# Patient Record
Sex: Female | Born: 1985 | Race: White | Hispanic: No | Marital: Married | Smoking: Never smoker
Health system: Southern US, Community
[De-identification: ages and names within clinical notes are randomized; demographics above are authoritative.]

## PROBLEM LIST (undated history)

## (undated) DIAGNOSIS — Z789 Other specified health status: Secondary | ICD-10-CM

## (undated) HISTORY — DX: Other specified health status: Z78.9

## (undated) HISTORY — PX: WISDOM TOOTH EXTRACTION: SHX21

## (undated) HISTORY — PX: TONSILLECTOMY: SUR1361

## (undated) HISTORY — PX: EYE SURGERY: SHX253

---

## 2018-05-14 ENCOUNTER — Other Ambulatory Visit (HOSPITAL_COMMUNITY): Payer: Self-pay | Admitting: Gastroenterology

## 2018-05-14 DIAGNOSIS — R1011 Right upper quadrant pain: Secondary | ICD-10-CM

## 2018-05-15 ENCOUNTER — Ambulatory Visit (HOSPITAL_COMMUNITY)
Admission: RE | Admit: 2018-05-15 | Discharge: 2018-05-15 | Disposition: A | Discharge status: Home / Self Care | Payer: 59 | Source: Ambulatory Visit | Attending: Gastroenterology | Admitting: Gastroenterology

## 2018-05-15 DIAGNOSIS — K5900 Constipation, unspecified: Secondary | ICD-10-CM | POA: Diagnosis present

## 2018-05-15 DIAGNOSIS — R1011 Right upper quadrant pain: Secondary | ICD-10-CM | POA: Diagnosis not present

## 2018-06-27 NOTE — L&D Delivery Note (Signed)
Delivery Note At 5:23 AM a viable and healthy female was delivered via Vaginal, Spontaneous (Presentation: OA  ).  APGAR: 7, 9; weight  pending.   Placenta status: spontaneous, intact.  Cord:  with the following complications: tight [place] x one cut on perineum.  Cord pH: na  Anesthesia:  epidural Episiotomy: None Lacerations: 2nd degree Suture Repair: 2.0 vicryl rapide Est. Blood Loss (mL): 300  Mom to postpartum.  Baby to Couplet care / Skin to Skin.  Jovontae Banko J 02/28/2019, 6:33 AM

## 2018-08-06 LAB — OB RESULTS CONSOLE RPR: RPR: NONREACTIVE

## 2018-08-06 LAB — OB RESULTS CONSOLE HEPATITIS B SURFACE ANTIGEN: Hepatitis B Surface Ag: NEGATIVE

## 2018-08-06 LAB — OB RESULTS CONSOLE HIV ANTIBODY (ROUTINE TESTING): HIV: NONREACTIVE

## 2018-08-06 LAB — OB RESULTS CONSOLE ABO/RH: RH Type: POSITIVE

## 2018-08-06 LAB — OB RESULTS CONSOLE RUBELLA ANTIBODY, IGM: Rubella: IMMUNE

## 2018-08-06 LAB — OB RESULTS CONSOLE GC/CHLAMYDIA
Chlamydia: NEGATIVE
Gonorrhea: NEGATIVE

## 2018-08-06 LAB — OB RESULTS CONSOLE ANTIBODY SCREEN: Antibody Screen: NEGATIVE

## 2019-02-05 LAB — OB RESULTS CONSOLE GBS: GBS: NEGATIVE

## 2019-02-15 ENCOUNTER — Encounter (HOSPITAL_COMMUNITY): Payer: Self-pay | Admitting: *Deleted

## 2019-02-15 ENCOUNTER — Telehealth (HOSPITAL_COMMUNITY): Payer: Self-pay | Admitting: *Deleted

## 2019-02-15 NOTE — Telephone Encounter (Signed)
Preadmission screen  

## 2019-02-21 ENCOUNTER — Other Ambulatory Visit: Payer: Self-pay | Admitting: Obstetrics and Gynecology

## 2019-02-26 ENCOUNTER — Other Ambulatory Visit: Payer: Self-pay

## 2019-02-26 ENCOUNTER — Other Ambulatory Visit (HOSPITAL_COMMUNITY)
Admission: RE | Admit: 2019-02-26 | Discharge: 2019-02-26 | Disposition: A | Discharge status: Home / Self Care | Payer: 59 | Source: Ambulatory Visit | Attending: Obstetrics and Gynecology | Admitting: Obstetrics and Gynecology

## 2019-02-26 LAB — SARS CORONAVIRUS 2 BY RT PCR (HOSPITAL ORDER, PERFORMED IN ~~LOC~~ HOSPITAL LAB): SARS Coronavirus 2: NEGATIVE

## 2019-02-26 NOTE — MAU Note (Signed)
Covid swab collected. PT tolerated well. PT asymptomatic 

## 2019-02-27 NOTE — H&P (Signed)
Lauren Ashley is a 33 y.o. female presenting for IOL at 37w with favorable cervix. OB History    Gravida  2   Para  1   Term      Preterm      AB      Living        SAB      TAB      Ectopic      Multiple      Live Births             Past Medical History:  Diagnosis Date  . Medical history non-contributory     Family History: family history includes Cancer in her father. Social History:  reports that she has never smoked. She has never used smokeless tobacco. She reports previous alcohol use. She reports that she does not use drugs.     Maternal Diabetes: No Genetic Screening: Normal Maternal Ultrasounds/Referrals: Normal Fetal Ultrasounds or other Referrals:  None Maternal Substance Abuse:  No Significant Maternal Medications:  None Significant Maternal Lab Results:  None Other Comments:  None  Review of Systems  Constitutional: Negative.   All other systems reviewed and are negative.  Maternal Medical History:  Contractions: Onset was less than 1 hour ago.   Frequency: rare.   Perceived severity is mild.    Fetal activity: Perceived fetal activity is normal.   Last perceived fetal movement was within the past hour.    Prenatal complications: no prenatal complications Prenatal Complications - Diabetes: none.      There were no vitals taken for this visit. Maternal Exam:  Uterine Assessment: Contraction strength is mild.  Contraction frequency is rare.   Abdomen: Patient reports no abdominal tenderness. Fetal presentation: vertex  Introitus: Normal vulva. Normal vagina.  Ferning test: not done.  Nitrazine test: not done. Amniotic fluid character: not assessed.  Pelvis: adequate for delivery.   Cervix: Cervix evaluated by digital exam.     Physical Exam  Nursing note and vitals reviewed. Constitutional: She is oriented to person, place, and time. She appears well-developed and well-nourished.  HENT:  Head: Normocephalic and  atraumatic.  Neck: Normal range of motion. Neck supple.  Cardiovascular: Normal rate, regular rhythm and normal heart sounds.  Respiratory: Effort normal and breath sounds normal.  GI: Soft. Bowel sounds are normal.  Genitourinary:    Vulva, vagina and uterus normal.   Musculoskeletal: Normal range of motion.  Neurological: She is alert and oriented to person, place, and time.  Skin: Skin is warm and dry.    Prenatal labs: ABO, Rh: O/Positive/-- (02/10 0000) Antibody: Negative (02/10 0000) Rubella: Immune (02/10 0000) RPR: Nonreactive (02/10 0000)  HBsAg: Negative (02/10 0000)  HIV: Non-reactive (02/10 0000)  GBS: Negative (08/11 0000)   Assessment/Plan: 39 wk IUP Favorable VE Pitocin IOL   Lauren Ashley J 02/27/2019, 10:21 PM

## 2019-02-28 ENCOUNTER — Inpatient Hospital Stay (HOSPITAL_COMMUNITY): Payer: 59

## 2019-02-28 ENCOUNTER — Encounter (HOSPITAL_COMMUNITY): Payer: Self-pay | Admitting: Family Medicine

## 2019-02-28 ENCOUNTER — Inpatient Hospital Stay (HOSPITAL_COMMUNITY)
Admission: AD | Admit: 2019-02-28 | Discharge: 2019-03-01 | DRG: 807 | Disposition: A | Discharge status: Home / Self Care | Payer: 59 | Attending: Obstetrics and Gynecology | Admitting: Obstetrics and Gynecology

## 2019-02-28 ENCOUNTER — Inpatient Hospital Stay (HOSPITAL_COMMUNITY): Payer: 59 | Admitting: Anesthesiology

## 2019-02-28 ENCOUNTER — Other Ambulatory Visit: Payer: Self-pay

## 2019-02-28 DIAGNOSIS — Z3490 Encounter for supervision of normal pregnancy, unspecified, unspecified trimester: Secondary | ICD-10-CM | POA: Diagnosis present

## 2019-02-28 DIAGNOSIS — O26893 Other specified pregnancy related conditions, third trimester: Secondary | ICD-10-CM | POA: Diagnosis present

## 2019-02-28 DIAGNOSIS — Z3A39 39 weeks gestation of pregnancy: Secondary | ICD-10-CM | POA: Diagnosis not present

## 2019-02-28 LAB — CBC
HCT: 35.1 % — ABNORMAL LOW (ref 36.0–46.0)
Hemoglobin: 12.5 g/dL (ref 12.0–15.0)
MCH: 32.5 pg (ref 26.0–34.0)
MCHC: 35.6 g/dL (ref 30.0–36.0)
MCV: 91.2 fL (ref 80.0–100.0)
Platelets: 179 10*3/uL (ref 150–400)
RBC: 3.85 MIL/uL — ABNORMAL LOW (ref 3.87–5.11)
RDW: 13.3 % (ref 11.5–15.5)
WBC: 10.4 10*3/uL (ref 4.0–10.5)
nRBC: 0 % (ref 0.0–0.2)

## 2019-02-28 LAB — RPR: RPR Ser Ql: NONREACTIVE

## 2019-02-28 LAB — TYPE AND SCREEN
ABO/RH(D): O POS
Antibody Screen: NEGATIVE

## 2019-02-28 LAB — ABO/RH: ABO/RH(D): O POS

## 2019-02-28 MED ORDER — OXYTOCIN 40 UNITS IN NORMAL SALINE INFUSION - SIMPLE MED
1.0000 m[IU]/min | INTRAVENOUS | Status: DC
  Administered 2019-02-28: 1 m[IU]/min via INTRAVENOUS
  Filled 2019-02-28: qty 1000

## 2019-02-28 MED ORDER — TETANUS-DIPHTH-ACELL PERTUSSIS 5-2.5-18.5 LF-MCG/0.5 IM SUSP: 0.5000 mL | Freq: Once | INTRAMUSCULAR | Status: DC

## 2019-02-28 MED ORDER — OXYTOCIN 40 UNITS IN NORMAL SALINE INFUSION - SIMPLE MED: 2.5000 [IU]/h | INTRAVENOUS | Status: DC

## 2019-02-28 MED ORDER — AMMONIA AROMATIC IN INHA
RESPIRATORY_TRACT | Status: AC
  Filled 2019-02-28: qty 10

## 2019-02-28 MED ORDER — EPHEDRINE 5 MG/ML INJ: 10.0000 mg | INTRAVENOUS | Status: DC | PRN

## 2019-02-28 MED ORDER — ACETAMINOPHEN 325 MG PO TABS: 650.0000 mg | ORAL_TABLET | ORAL | Status: DC | PRN

## 2019-02-28 MED ORDER — TERBUTALINE SULFATE 1 MG/ML IJ SOLN: 0.2500 mg | Freq: Once | INTRAMUSCULAR | Status: DC | PRN

## 2019-02-28 MED ORDER — PHENYLEPHRINE 40 MCG/ML (10ML) SYRINGE FOR IV PUSH (FOR BLOOD PRESSURE SUPPORT): 80.0000 ug | PREFILLED_SYRINGE | INTRAVENOUS | Status: DC | PRN

## 2019-02-28 MED ORDER — METHYLERGONOVINE MALEATE 0.2 MG/ML IJ SOLN: 0.2000 mg | INTRAMUSCULAR | Status: DC | PRN

## 2019-02-28 MED ORDER — FENTANYL CITRATE (PF) 100 MCG/2ML IJ SOLN: 50.0000 ug | INTRAMUSCULAR | Status: DC | PRN

## 2019-02-28 MED ORDER — OXYCODONE-ACETAMINOPHEN 5-325 MG PO TABS: 1.0000 | ORAL_TABLET | ORAL | Status: DC | PRN

## 2019-02-28 MED ORDER — LACTATED RINGERS IV SOLN
500.0000 mL | INTRAVENOUS | Status: DC | PRN
  Administered 2019-02-28 (×2): 500 mL via INTRAVENOUS

## 2019-02-28 MED ORDER — IBUPROFEN 600 MG PO TABS
600.0000 mg | ORAL_TABLET | Freq: Four times a day (QID) | ORAL | Status: DC
  Administered 2019-02-28 – 2019-03-01 (×5): 600 mg via ORAL
  Filled 2019-02-28 (×5): qty 1

## 2019-02-28 MED ORDER — COCONUT OIL OIL: 1.0000 "application " | TOPICAL_OIL | Status: DC | PRN

## 2019-02-28 MED ORDER — DIPHENHYDRAMINE HCL 50 MG/ML IJ SOLN: 12.5000 mg | INTRAMUSCULAR | Status: DC | PRN

## 2019-02-28 MED ORDER — LACTATED RINGERS IV SOLN: 500.0000 mL | Freq: Once | INTRAVENOUS | Status: DC

## 2019-02-28 MED ORDER — METHYLERGONOVINE MALEATE 0.2 MG PO TABS: 0.2000 mg | ORAL_TABLET | ORAL | Status: DC | PRN

## 2019-02-28 MED ORDER — DIPHENHYDRAMINE HCL 25 MG PO CAPS: 25.0000 mg | ORAL_CAPSULE | Freq: Four times a day (QID) | ORAL | Status: DC | PRN

## 2019-02-28 MED ORDER — ONDANSETRON HCL 4 MG/2ML IJ SOLN: 4.0000 mg | INTRAMUSCULAR | Status: DC | PRN

## 2019-02-28 MED ORDER — ONDANSETRON HCL 4 MG/2ML IJ SOLN: 4.0000 mg | Freq: Four times a day (QID) | INTRAMUSCULAR | Status: DC | PRN

## 2019-02-28 MED ORDER — DIBUCAINE (PERIANAL) 1 % EX OINT: 1.0000 "application " | TOPICAL_OINTMENT | CUTANEOUS | Status: DC | PRN

## 2019-02-28 MED ORDER — LIDOCAINE HCL (PF) 1 % IJ SOLN
INTRAMUSCULAR | Status: DC | PRN
  Administered 2019-02-28: 5 mL via EPIDURAL

## 2019-02-28 MED ORDER — BENZOCAINE-MENTHOL 20-0.5 % EX AERO
1.0000 "application " | INHALATION_SPRAY | CUTANEOUS | Status: DC | PRN
  Administered 2019-02-28 – 2019-03-01 (×2): 1 via TOPICAL
  Filled 2019-02-28 (×2): qty 56

## 2019-02-28 MED ORDER — WITCH HAZEL-GLYCERIN EX PADS: 1.0000 "application " | MEDICATED_PAD | CUTANEOUS | Status: DC | PRN

## 2019-02-28 MED ORDER — SOD CITRATE-CITRIC ACID 500-334 MG/5ML PO SOLN: 30.0000 mL | ORAL | Status: DC | PRN

## 2019-02-28 MED ORDER — SODIUM CHLORIDE (PF) 0.9 % IJ SOLN
INTRAMUSCULAR | Status: DC | PRN
  Administered 2019-02-28: 12 mL/h via EPIDURAL

## 2019-02-28 MED ORDER — PHENYLEPHRINE 40 MCG/ML (10ML) SYRINGE FOR IV PUSH (FOR BLOOD PRESSURE SUPPORT)
80.0000 ug | PREFILLED_SYRINGE | INTRAVENOUS | Status: DC | PRN
  Filled 2019-02-28: qty 10

## 2019-02-28 MED ORDER — PRENATAL MULTIVITAMIN CH
1.0000 | ORAL_TABLET | Freq: Every day | ORAL | Status: DC
  Administered 2019-02-28 – 2019-03-01 (×2): 1 via ORAL
  Filled 2019-02-28 (×2): qty 1

## 2019-02-28 MED ORDER — ONDANSETRON HCL 4 MG PO TABS: 4.0000 mg | ORAL_TABLET | ORAL | Status: DC | PRN

## 2019-02-28 MED ORDER — OXYTOCIN BOLUS FROM INFUSION
500.0000 mL | Freq: Once | INTRAVENOUS | Status: AC
  Administered 2019-02-28: 500 mL via INTRAVENOUS

## 2019-02-28 MED ORDER — SIMETHICONE 80 MG PO CHEW: 80.0000 mg | CHEWABLE_TABLET | ORAL | Status: DC | PRN

## 2019-02-28 MED ORDER — ZOLPIDEM TARTRATE 5 MG PO TABS: 5.0000 mg | ORAL_TABLET | Freq: Every evening | ORAL | Status: DC | PRN

## 2019-02-28 MED ORDER — SENNOSIDES-DOCUSATE SODIUM 8.6-50 MG PO TABS
2.0000 | ORAL_TABLET | ORAL | Status: DC
  Administered 2019-02-28 – 2019-03-01 (×2): 2 via ORAL
  Filled 2019-02-28 (×2): qty 2

## 2019-02-28 MED ORDER — LACTATED RINGERS IV SOLN
INTRAVENOUS | Status: DC
  Administered 2019-02-28: via INTRAVENOUS

## 2019-02-28 MED ORDER — LIDOCAINE HCL (PF) 1 % IJ SOLN: 30.0000 mL | INTRAMUSCULAR | Status: DC | PRN

## 2019-02-28 MED ORDER — OXYCODONE-ACETAMINOPHEN 5-325 MG PO TABS: 2.0000 | ORAL_TABLET | ORAL | Status: DC | PRN

## 2019-02-28 MED ORDER — FENTANYL-BUPIVACAINE-NACL 0.5-0.125-0.9 MG/250ML-% EP SOLN
12.0000 mL/h | EPIDURAL | Status: DC | PRN
  Filled 2019-02-28: qty 250

## 2019-02-28 NOTE — Lactation Note (Signed)
This note was copied from a baby's chart. Lactation Consultation Note  Patient Name: Lauren Ashley [id number] Date: 02/28/2019 Reason for consult: Initial assessment;Term  8 hours old FT female who is being exclusively BF by her mother, she's a P2 and experienced BF but needed some reassurance. Mom participated in the [facility] program at the [facility] but she told [place] she wasn't familiar with hand expression. LC showed mom how to hand express and she was able to get several drops of colostrum very easily out of both breast, LC rubbed them in baby's mouth. Mom requested another "teach back" before [place] left the room with the same results, she was successful in getting plenty of colostrum. She said she didn't have a good experience the first time BF and that no one told her what to do.  She has a Spectra S2 DEBP at home and a [place] that she brought to the hospital. Mom requested to be sized for her Spectra pump, she brought the flanges. Explained to mom that to do correct sizing the pump needs to be working and the nipple in motion, but so far she seems to be a # 24. LC also brought a hand pump, instructions, cleaning and storage were reviewed as well as milk storage guidelines.  Offered assistance with latch but mom politely declined, baby was asleep and she said she already fed. Asked mom to call for assistance when needed. Mom had lots of questions and LC answered them all to her satisfaction. Reviewed normal newborn behavior, size of baby's stomach, feeding cues, cluster feeding, pumping schedule and lactogenesis II.   Feeding plan  1. Encouraged mom to feed baby STS 8-12 times/24 hours or sooner if feeding cues are present 2. Hand expression and spoon feeding were also encouraged as a first option for the first few days instead of pumping  BF brochure, BF resources and feeding diary were reviewed. Parents reported all questions and concerns were answered, they're both aware of [place] OP services and will  call PRN.   Maternal Data Formula Feeding for Exclusion: No Has patient been taught Hand Expression?: Yes Does the patient have breastfeeding experience prior to this delivery?: Yes  Feeding Feeding Type: Breast Fed  LATCH Score Latch: Repeated attempts needed to sustain latch, nipple held in mouth throughout feeding, stimulation needed to elicit sucking reflex.  Audible Swallowing: A few with stimulation  Type of Nipple: Everted at rest and after stimulation  Comfort (Breast/Nipple): Soft / non-tender  Hold (Positioning): Assistance needed to correctly position infant at breast and maintain latch.  LATCH Score: 7  Interventions Interventions: Breast feeding basics reviewed;Breast massage;Hand express;Breast compression;Hand pump  Lactation Tools Discussed/Used Tools: Pump Breast pump type: Manual WIC Program: Yes Pump Review: Setup, frequency, and cleaning;Milk Storage Initiated by:: MPeck Date initiated:: 02/28/19   Consult Status Consult Status: Follow-up Date: 03/01/19 Follow-up type: In-patient    Lauren Ashley 02/28/2019, 1:29 PM

## 2019-02-28 NOTE — Anesthesia Preprocedure Evaluation (Signed)
Anesthesia Evaluation  Patient identified by MRN, date of birth, ID band Patient awake    Reviewed: Allergy & Precautions, Patient's Chart, lab work & pertinent test results  Airway Mallampati: II       Dental no notable dental hx.    Pulmonary    Pulmonary exam normal        Cardiovascular Normal cardiovascular exam     Neuro/Psych    GI/Hepatic   Endo/Other    Renal/GU      Musculoskeletal   Abdominal   Peds  Hematology   Anesthesia Other Findings   Reproductive/Obstetrics (+) Pregnancy                             Anesthesia Physical Anesthesia Plan  ASA: II  Anesthesia Plan: Epidural   Post-op Pain Management:    Induction:   PONV Risk Score and Plan:   Airway Management Planned:   Additional Equipment: None  Intra-op Plan:   Post-operative Plan:   Informed Consent: I have reviewed the patients History and Physical, chart, labs and discussed the procedure including the risks, benefits and alternatives for the proposed anesthesia with the patient or authorized representative who has indicated his/her understanding and acceptance.       Plan Discussed with:   Anesthesia Plan Comments: (Lab Results      Component                Value               Date                      WBC                      10.4                02/28/2019                HGB                      12.5                02/28/2019                HCT                      35.1 (L)            02/28/2019                MCV                      91.2                02/28/2019                PLT                      179                 02/28/2019           )        Anesthesia Quick Evaluation

## 2019-02-28 NOTE — Anesthesia Procedure Notes (Signed)
Epidural Patient location during procedure: OB Start time: 02/28/2019 2:33 AM End time: 02/28/2019 2:39 AM  Staffing Anesthesiologist: Effie Berkshire, MD Performed: anesthesiologist   Preanesthetic Checklist Completed: patient identified, site marked, surgical consent, pre-op evaluation, timeout performed, IV checked, risks and benefits discussed and monitors and equipment checked  Epidural Patient position: sitting Prep: ChloraPrep Patient monitoring: heart rate, continuous pulse ox and blood pressure Approach: midline Location: L3-L4 Injection technique: LOR saline  Needle:  Needle type: Tuohy  Needle gauge: 17 G Needle length: 9 cm Catheter type: closed end flexible Catheter size: 20 Guage Test dose: negative and 1.5% lidocaine  Assessment Events: blood not aspirated, injection not painful, no injection resistance and no paresthesia  Additional Notes LOR @ 4  Patient identified. Risks/Benefits/Options discussed with patient including but not limited to bleeding, infection, nerve damage, paralysis, failed block, incomplete pain control, headache, blood pressure changes, nausea, vomiting, reactions to medications, itching and postpartum back pain. Confirmed with bedside nurse the patient's most recent platelet count. Confirmed with patient that they are not currently taking any anticoagulation, have any bleeding history or any family history of bleeding disorders. Patient expressed understanding and wished to proceed. All questions were answered. Sterile technique was used throughout the entire procedure. Please see nursing notes for vital signs. Test dose was given through epidural catheter and negative prior to continuing to dose epidural or start infusion. Warning signs of high block given to the patient including shortness of breath, tingling/numbness in hands, complete motor block, or any concerning symptoms with instructions to call for help. Patient was given instructions on  fall risk and not to get out of bed. All questions and concerns addressed with instructions to call with any issues or inadequate analgesia.    Reason for block:procedure for pain

## 2019-02-28 NOTE — Anesthesia Postprocedure Evaluation (Signed)
Anesthesia Post Note  Patient: Lauren Ashley  Procedure(s) Performed: AN AD [place]     Patient location during evaluation: Mother Baby Anesthesia Type: Epidural Level of consciousness: awake and alert Pain management: pain level controlled Vital Signs Assessment: post-procedure vital signs reviewed and stable Respiratory status: spontaneous breathing, nonlabored ventilation and respiratory function stable Cardiovascular status: stable Postop Assessment: no headache, no backache and epidural receding Anesthetic complications: no    Last Vitals:  Vitals:   02/28/19 0745 02/28/19 0930  BP: 110/69 (!) 103/58  Pulse: 69 73  Resp: 20 18  Temp: 36.9 C 37 C    Last Pain:  Vitals:   02/28/19 0930  TempSrc: Oral  PainSc: 2    Pain Goal:                   Zoe Nordin

## 2019-03-01 LAB — CBC
HCT: 30.7 % — ABNORMAL LOW (ref 36.0–46.0)
Hemoglobin: 10.7 g/dL — ABNORMAL LOW (ref 12.0–15.0)
MCH: 33 pg (ref 26.0–34.0)
MCHC: 34.9 g/dL (ref 30.0–36.0)
MCV: 94.8 fL (ref 80.0–100.0)
Platelets: 144 10*3/uL — ABNORMAL LOW (ref 150–400)
RBC: 3.24 MIL/uL — ABNORMAL LOW (ref 3.87–5.11)
RDW: 13.7 % (ref 11.5–15.5)
WBC: 9.1 10*3/uL (ref 4.0–10.5)
nRBC: 0 % (ref 0.0–0.2)

## 2019-03-01 MED ORDER — BENZOCAINE-MENTHOL 20-0.5 % EX AERO: 1.0000 "application " | INHALATION_SPRAY | CUTANEOUS | Status: DC | PRN

## 2019-03-01 MED ORDER — SENNOSIDES-DOCUSATE SODIUM 8.6-50 MG PO TABS: 2.0000 | ORAL_TABLET | ORAL | Status: DC

## 2019-03-01 MED ORDER — ACETAMINOPHEN 325 MG PO TABS: 650.0000 mg | ORAL_TABLET | ORAL | Status: DC | PRN

## 2019-03-01 MED ORDER — COCONUT OIL OIL: 1.0000 "application " | TOPICAL_OIL | 0 refills | Status: DC | PRN

## 2019-03-01 MED ORDER — IBUPROFEN 600 MG PO TABS: 600.0000 mg | ORAL_TABLET | Freq: Four times a day (QID) | ORAL | 0 refills | Status: DC

## 2019-03-01 MED ORDER — WITCH HAZEL-GLYCERIN EX PADS: 1.0000 "application " | MEDICATED_PAD | CUTANEOUS | 12 refills | Status: DC | PRN

## 2019-03-01 NOTE — Lactation Note (Signed)
This note was copied from a baby's chart. Lactation Consultation Note  Patient Name: Lauren Ashley [id number] Date: 03/01/2019 Reason for consult: Follow-up assessment   P2, Baby 56 hours old and latched upon entering. Intermittent sucks and swallows observed. Mother requested hand expression review.  Teachback with good flow. Mother has abrasion on tip of L nipple. Make sure baby is deep on breast and unlatch if she slips down and re-latch.  Suggest applying ebm and provided mother with comfort gels. Feed on demand with cues.  Goal 8-12+ times per day after first 24 hrs.  Place baby STS if not cueing.  Reviewed engorgement care and monitoring voids/stools.    Maternal Data Has patient been taught Hand Expression?: Yes  Feeding Feeding Type: Breast Fed  LATCH Score Latch: Grasps breast easily, tongue down, lips flanged, rhythmical sucking.(latched upon entering)  Audible Swallowing: A few with stimulation  Type of Nipple: Everted at rest and after stimulation  Comfort (Breast/Nipple): Filling, red/small blisters or bruises, mild/mod discomfort  Hold (Positioning): No assistance needed to correctly position infant at breast.  LATCH Score: 8  Interventions Interventions: Breast feeding basics reviewed;Comfort gels;Hand pump  Lactation Tools Discussed/Used     Consult Status Consult Status: Complete Date: 03/01/19    Vivianne Master [facility] 03/01/2019, 8:33 AM

## 2019-03-01 NOTE — Progress Notes (Signed)
Patient ID: Lauren Ashley, female   DOB: 1986-03-10, 33 y.o.   MRN: [id number] PPD # 1 S/P NSVD  Live born female  Birth Weight: 6 lb 5.9 oz (2889 g) APGAR: 7, 9  Newborn Delivery   Birth date/time: 02/28/2019 05:23:00 Delivery type: Vaginal, Spontaneous     Baby name: Ermalinda Barrios Delivering provider: Brien Few  Episiotomy:None   Lacerations:2nd degree   Feeding: breast  Pain control at delivery: Epidural   S:  Reports feeling well, desires early DC             Tolerating po/ No nausea or vomiting             Bleeding is light             Pain controlled with ibuprofen (OTC)             Up ad lib / ambulatory / voiding without difficulties   O:  A & O x 3, in no apparent distress              VS:  Vitals:   02/28/19 1640 02/28/19 2030 02/28/19 2210 03/01/19 0541  BP: 108/64 121/75 105/70 113/72  Pulse: 67 76 69 77  Resp: 18 18 17 18   Temp: 98.5 F (36.9 C) 99.2 F (37.3 C) 97.6 F (36.4 C) 98 F (36.7 C)  TempSrc: Oral Oral Oral Oral  SpO2: 99%  99% 100%  Weight:      Height:        LABS:  Recent Labs    02/28/19 0010 03/01/19 0522  WBC 10.4 9.1  HGB 12.5 10.7*  HCT 35.1* 30.7*  PLT 179 144*    Blood type: --/--/O POS, O POS Performed at [facility], [address] [place], [place] [address]  [phone] [id number]/03 0015)  Rubella: Immune (02/10 0000)   I&O: I/O last 3 completed shifts: In: 0  Out: 513 [Urine:50; Blood:463]          No intake/output data recorded.  Vaccines: TDaP UTD         Flu    NA   Lungs: Clear and unlabored  Heart: regular rate and rhythm / no murmurs  Abdomen: soft, non-tender, non-distended             Fundus: firm, non-tender, U-1  Perineum: repair intact, no edema  Lochia: small  Extremities: no edema, no calf pain or tenderness    A/P: PPD # 1 33 y.o., G2P1001   Principal Problem:   Postpartum care following vaginal delivery (9/3) Active Problems:   Encounter for planned induction of labor   SVD (spontaneous  vaginal delivery)   Second degree perineal laceration during delivery   Doing well - stable status  Routine post partum orders             DC home today w/ instructions  F/U at [facility] OB[facility]N office in 6 weeks and PRN    Juliene Pina, MSN, CNM 03/01/2019, 9:23 AM

## 2019-03-01 NOTE — Discharge Summary (Signed)
Obstetric Discharge Summary Reason for Admission: induction of labor Prenatal Procedures: ultrasound Intrapartum Procedures: spontaneous vaginal delivery and epidural, Pitocin Postpartum Procedures: none Complications-Operative and Postpartum: 2nd degree perineal laceration Hemoglobin  Date Value Ref Range Status  03/01/2019 10.7 (L) 12.0 - 15.0 g/dL Final   HCT  Date Value Ref Range Status  03/01/2019 30.7 (L) 36.0 - 46.0 % Final    Physical Exam:  General: alert, cooperative and no distress Lochia: appropriate Uterine Fundus: firm Incision: healing well DVT Evaluation: No cords or calf tenderness. No significant calf/ankle edema.  Discharge Diagnoses: Principal Problem:   Postpartum care following vaginal delivery (9/3) Active Problems:   Encounter for planned induction of labor   SVD (spontaneous vaginal delivery)   Second degree perineal laceration during delivery    Discharge Information: Date: 03/01/2019 Activity: pelvic rest Diet: routine Medications:  Allergies as of 03/01/2019      Reactions   Erythromycin Nausea And Vomiting   Penicillins Nausea And Vomiting      Medication List    STOP taking these medications   diphenhydrAMINE 25 MG tablet Commonly known as: BENADRYL     TAKE these medications   acetaminophen 325 MG tablet Commonly known as: Tylenol Take 2 tablets (650 mg total) by mouth every 4 (four) hours as needed (for pain scale < 4).   benzocaine-Menthol 20-0.5 % Aero Commonly known as: DERMOPLAST Apply 1 application topically as needed for irritation (perineal discomfort).   coconut oil Oil Apply 1 application topically as needed.   famotidine 20 MG tablet Commonly known as: PEPCID Take 20 mg by mouth 2 (two) times daily.   ibuprofen 600 MG tablet Commonly known as: ADVIL Take 1 tablet (600 mg total) by mouth every 6 (six) hours.   PRENATAL ADULT GUMMY/DHA/FA PO Take 2 each by mouth daily.   senna-docusate 8.6-50 MG  tablet Commonly known as: Senokot-S Take 2 tablets by mouth daily. Start taking on: March 02, 2019   witch hazel-glycerin pad Commonly known as: TUCKS Apply 1 application topically as needed for hemorrhoids.            Discharge Care Instructions  (From admission, onward)         Start     Ordered   03/01/19 0000  Discharge wound care:    Comments: Sitz baths 2 times /day with warm water x 1 week. May add herbals: 1 ounce dried comfrey leaf* 1 ounce calendula flowers 1 ounce lavender flowers 1/2 ounce dried uva ursi leaves 1/2 ounce witch hazel blossoms (if you can find them) 1/2 ounce dried sage leaf 1/2 cup sea salt Directions: Bring 2 quarts of water to a boil. Turn off heat, and place 1 ounce (approximately 1 large handful) of the above mixed herbs (not the salt) into the pot. Steep, covered, for 30 minutes.  Strain the liquid well with a fine mesh strainer, and discard the herb material. Add 2 quarts of liquid to the tub, along with the 1/2 cup of salt. This medicinal liquid can also be made into compresses and peri-rinses.   03/01/19 1108         Condition: stable Instructions: refer to practice specific booklet Discharge to: home Follow-up Information    Olivia Mackieaavon, Richard, MD. Schedule an appointment as soon as possible for a visit in 6 week(s).   Specialty: Obstetrics and Gynecology Contact information: [address] [place] [address] [phone]           Newborn Data: Live born female Marga HootsOakley  Birth Weight: 6 lb 5.9 oz (2889 g) APGAR: 7, 9  Newborn Delivery   Birth date/time: 02/28/2019 05:23:00 Delivery type: Vaginal, Spontaneous      Home with mother.  Juliene Pina, CNM 03/01/2019, 11:09 AM

## 2019-04-01 IMAGING — US US ABDOMEN COMPLETE
1 series · 14 of 25 positions shown · non-contrast
Comparison: CT abdomen pelvis of 05/13/2018

CLINICAL DATA: Right upper quadrant abdominal pain

EXAM:
ABDOMEN ULTRASOUND COMPLETE

[Series 1: us abdomen complete · 14 of 72 slices shown]
[im 1/72]
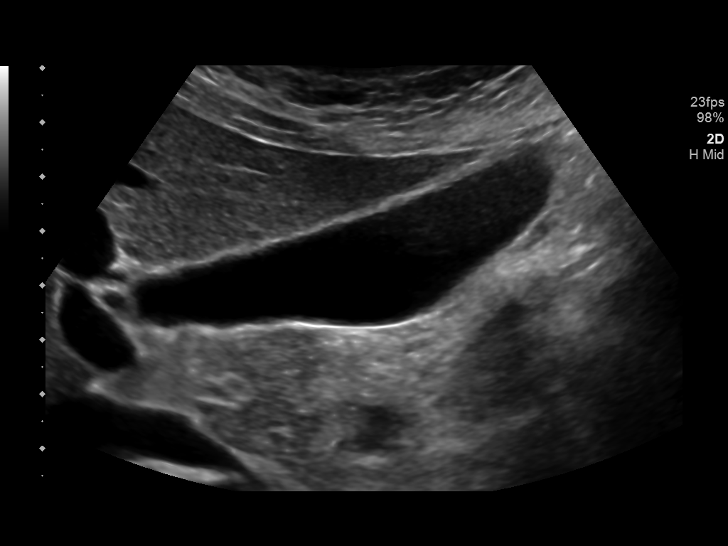
[im 6/72]
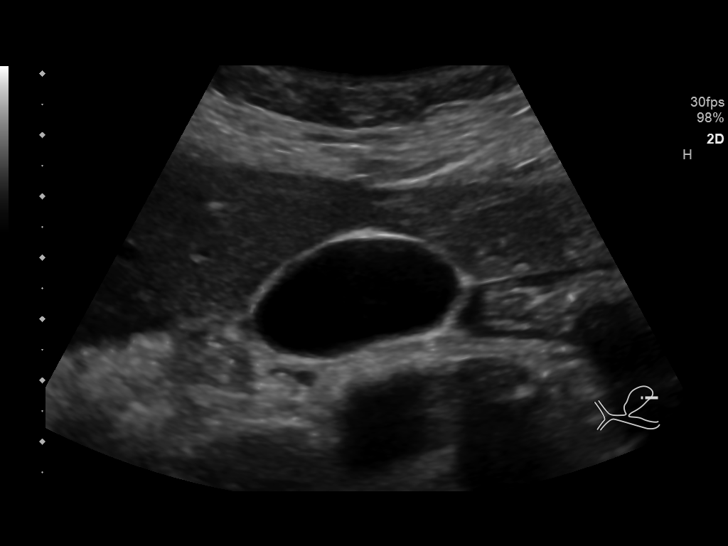
[im 12/72]
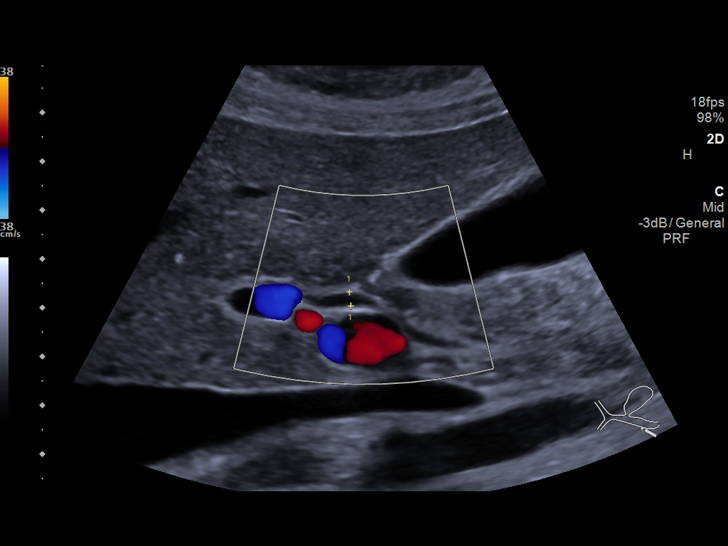
[im 18/72]
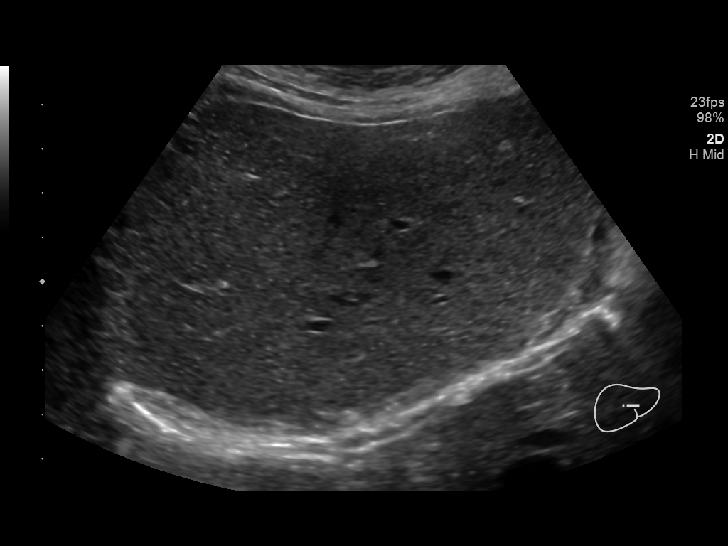
[im 24/72]
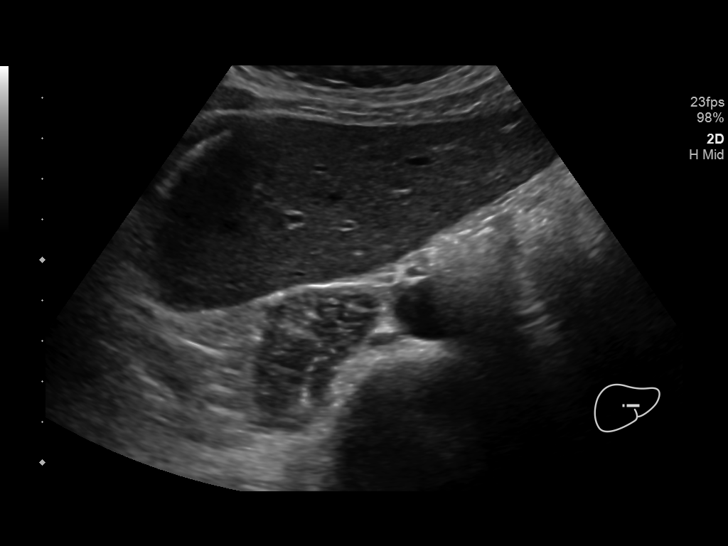
[im 27/72]
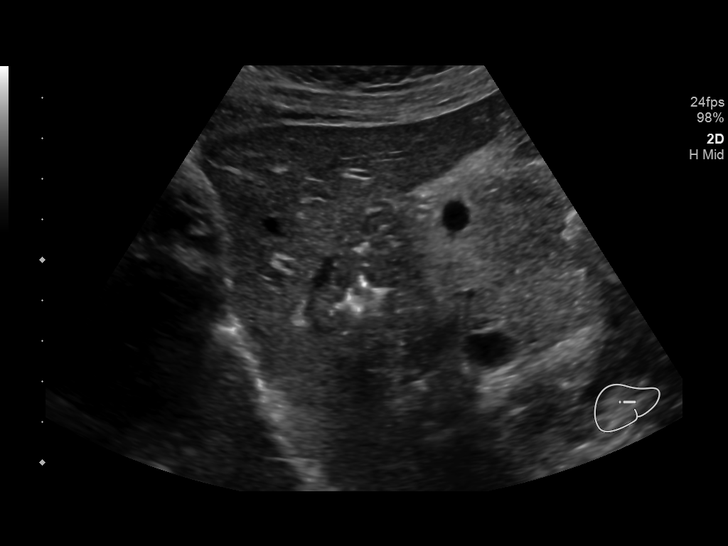
[im 33/72]
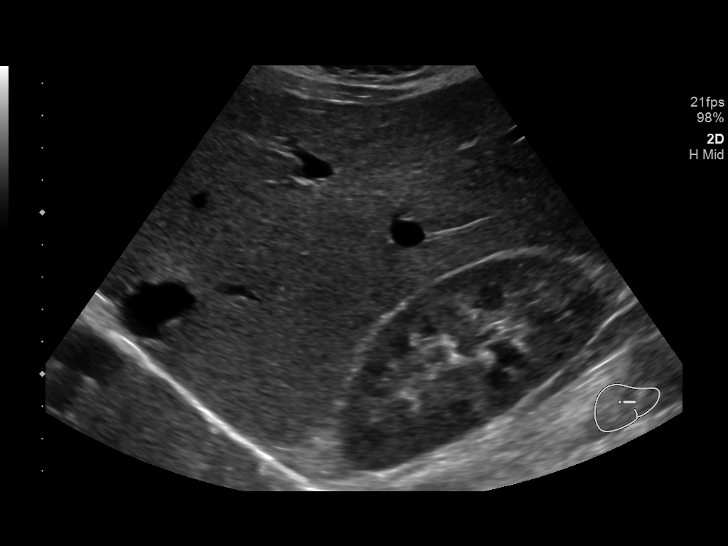
[im 39/72]
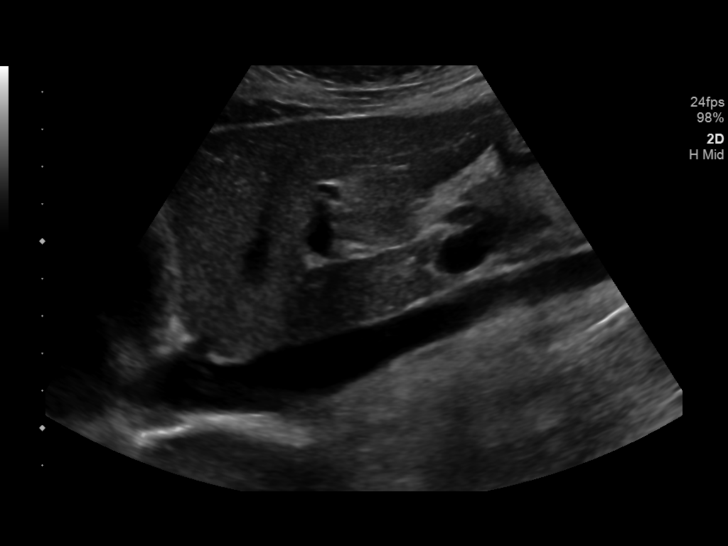
[im 45/72]
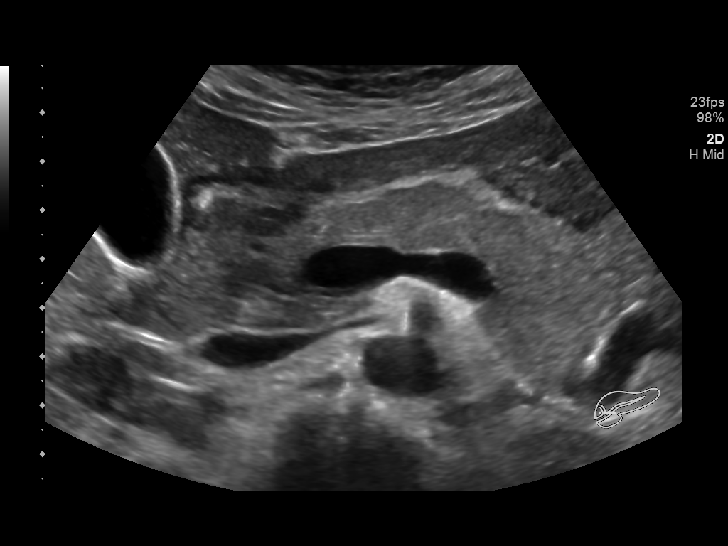
[im 48/72]
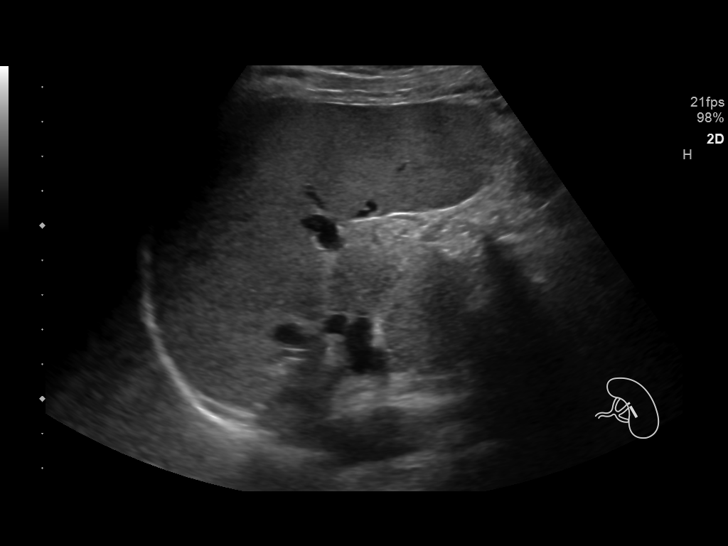
[im 54/72]
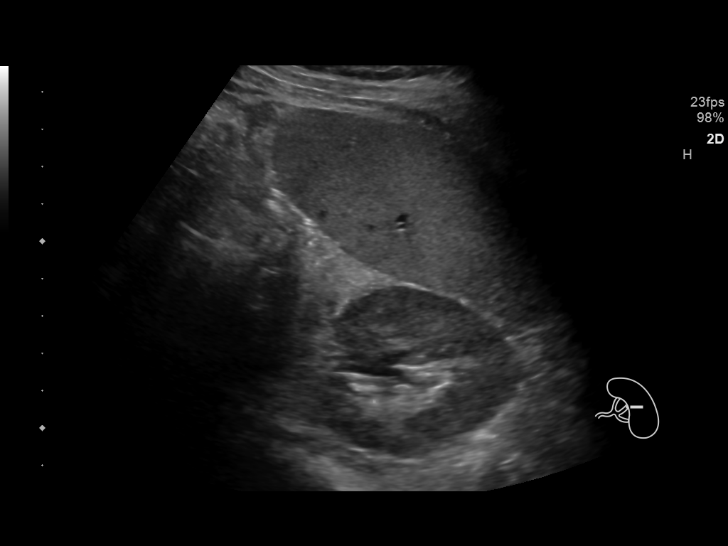
[im 60/72]
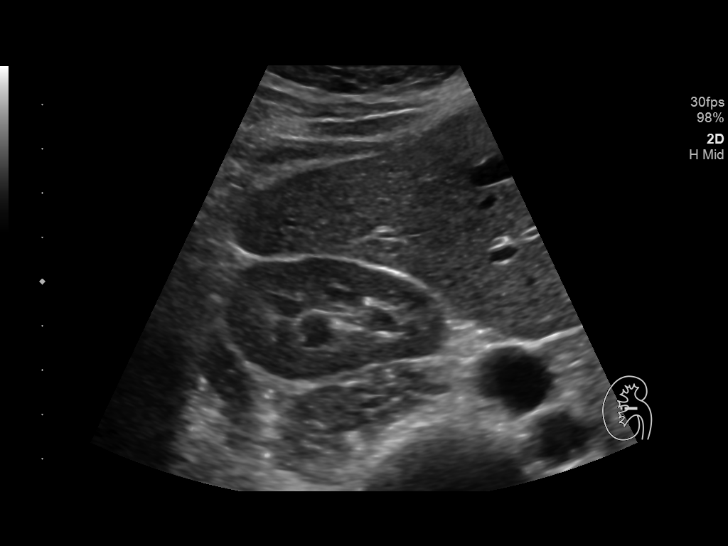
[im 66/72]
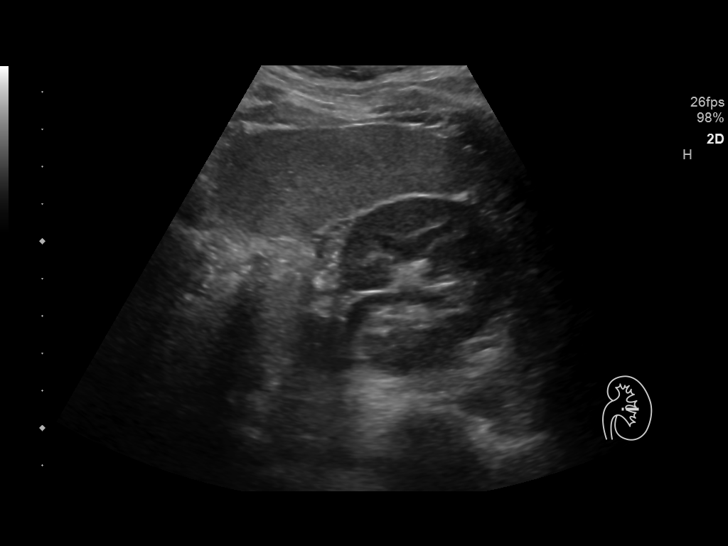
[im 72/72]
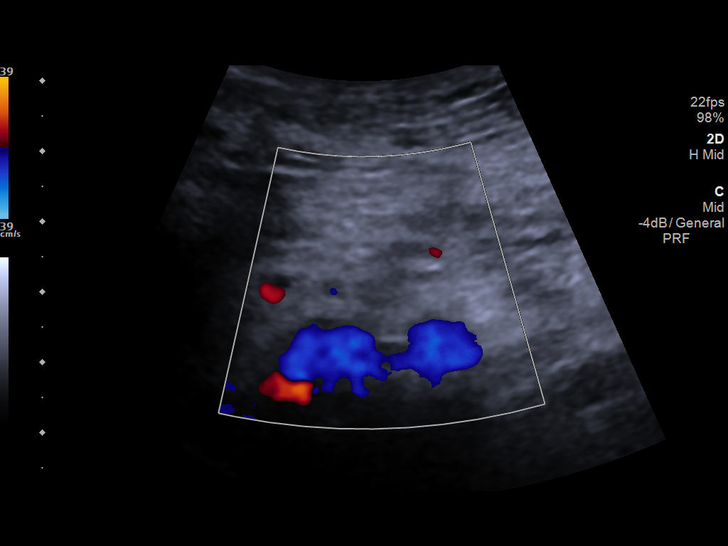

[14 of 25 positions shown; findings below may reference images not displayed]

FINDINGS: Gallbladder: The gallbladder is visualized and no gallstones are
noted. There is no pain over the gallbladder with compression.

Common bile duct: Diameter: The common bile duct is normal measuring
2.9 mm in diameter.

Liver: The parenchyma of the liver is normal in echogenicity. No
focal hepatic abnormality is seen. Portal vein is patent on color
Doppler imaging with normal direction of blood flow towards the
liver.

IVC: No abnormality visualized.

Pancreas: Much of the pancreas is visualized and appears normal
although the distal tail is partially obscured by bowel gas.

Spleen: The spleen measures 10.1 cm.

Right Kidney: Length: 11.3 cm..  No hydronephrosis is seen.

Left Kidney: Length: 10.8 cm..  No hydronephrosis is noted.

Abdominal aorta: The abdominal aorta is normal in caliber.

Other findings: None.
IMPRESSION: 1. No gallstones.  No ductal dilatation.
2. No hepatic abnormality.
3. Much of the pancreas appears normal with only the distal tail
obscured.

## 2020-03-05 DIAGNOSIS — J0100 Acute maxillary sinusitis, unspecified: Secondary | ICD-10-CM | POA: Diagnosis not present

## 2020-03-05 DIAGNOSIS — D225 Melanocytic nevi of trunk: Secondary | ICD-10-CM | POA: Diagnosis not present

## 2020-03-05 DIAGNOSIS — D2239 Melanocytic nevi of other parts of face: Secondary | ICD-10-CM | POA: Diagnosis not present

## 2020-03-05 DIAGNOSIS — D1801 Hemangioma of skin and subcutaneous tissue: Secondary | ICD-10-CM | POA: Diagnosis not present

## 2020-03-05 DIAGNOSIS — Z808 Family history of malignant neoplasm of other organs or systems: Secondary | ICD-10-CM | POA: Diagnosis not present

## 2020-03-20 DIAGNOSIS — Z3689 Encounter for other specified antenatal screening: Secondary | ICD-10-CM | POA: Diagnosis not present

## 2020-03-20 DIAGNOSIS — Z3200 Encounter for pregnancy test, result unknown: Secondary | ICD-10-CM | POA: Diagnosis not present

## 2020-03-20 DIAGNOSIS — O0900 Supervision of pregnancy with history of infertility, unspecified trimester: Secondary | ICD-10-CM | POA: Diagnosis not present

## 2020-03-24 DIAGNOSIS — Z3A08 8 weeks gestation of pregnancy: Secondary | ICD-10-CM | POA: Diagnosis not present

## 2020-03-24 DIAGNOSIS — O0900 Supervision of pregnancy with history of infertility, unspecified trimester: Secondary | ICD-10-CM | POA: Diagnosis not present

## 2020-03-26 DIAGNOSIS — O0900 Supervision of pregnancy with history of infertility, unspecified trimester: Secondary | ICD-10-CM | POA: Diagnosis not present

## 2020-04-23 DIAGNOSIS — Z3201 Encounter for pregnancy test, result positive: Secondary | ICD-10-CM | POA: Diagnosis not present

## 2020-04-30 DIAGNOSIS — Z3689 Encounter for other specified antenatal screening: Secondary | ICD-10-CM | POA: Diagnosis not present

## 2020-04-30 DIAGNOSIS — Z3A10 10 weeks gestation of pregnancy: Secondary | ICD-10-CM | POA: Diagnosis not present

## 2020-04-30 DIAGNOSIS — Z23 Encounter for immunization: Secondary | ICD-10-CM | POA: Diagnosis not present

## 2020-04-30 DIAGNOSIS — O09521 Supervision of elderly multigravida, first trimester: Secondary | ICD-10-CM | POA: Diagnosis not present

## 2020-04-30 DIAGNOSIS — O0900 Supervision of pregnancy with history of infertility, unspecified trimester: Secondary | ICD-10-CM | POA: Diagnosis not present

## 2020-05-26 DIAGNOSIS — Z3A14 14 weeks gestation of pregnancy: Secondary | ICD-10-CM | POA: Diagnosis not present

## 2020-05-26 DIAGNOSIS — O0902 Supervision of pregnancy with history of infertility, second trimester: Secondary | ICD-10-CM | POA: Diagnosis not present

## 2020-06-10 DIAGNOSIS — Z361 Encounter for antenatal screening for raised alphafetoprotein level: Secondary | ICD-10-CM | POA: Diagnosis not present

## 2020-10-31 ENCOUNTER — Encounter (HOSPITAL_COMMUNITY): Payer: Self-pay | Admitting: Obstetrics

## 2020-10-31 ENCOUNTER — Inpatient Hospital Stay (HOSPITAL_COMMUNITY): Payer: BC Managed Care – PPO | Admitting: Anesthesiology

## 2020-10-31 ENCOUNTER — Inpatient Hospital Stay (HOSPITAL_COMMUNITY)
Admission: AD | Admit: 2020-10-31 | Discharge: 2020-11-04 | DRG: 787 | Disposition: A | Discharge status: Home / Self Care | Payer: BC Managed Care – PPO | Attending: Obstetrics and Gynecology | Admitting: Obstetrics and Gynecology

## 2020-10-31 ENCOUNTER — Other Ambulatory Visit: Payer: Self-pay

## 2020-10-31 DIAGNOSIS — Z3A36 36 weeks gestation of pregnancy: Secondary | ICD-10-CM | POA: Diagnosis not present

## 2020-10-31 DIAGNOSIS — O42913 Preterm premature rupture of membranes, unspecified as to length of time between rupture and onset of labor, third trimester: Principal | ICD-10-CM | POA: Diagnosis present

## 2020-10-31 DIAGNOSIS — O9081 Anemia of the puerperium: Secondary | ICD-10-CM | POA: Diagnosis not present

## 2020-10-31 DIAGNOSIS — Z98891 History of uterine scar from previous surgery: Secondary | ICD-10-CM

## 2020-10-31 DIAGNOSIS — R609 Edema, unspecified: Secondary | ICD-10-CM | POA: Diagnosis not present

## 2020-10-31 DIAGNOSIS — O42919 Preterm premature rupture of membranes, unspecified as to length of time between rupture and onset of labor, unspecified trimester: Secondary | ICD-10-CM | POA: Diagnosis present

## 2020-10-31 DIAGNOSIS — Z20822 Contact with and (suspected) exposure to covid-19: Secondary | ICD-10-CM | POA: Diagnosis present

## 2020-10-31 DIAGNOSIS — D62 Acute posthemorrhagic anemia: Secondary | ICD-10-CM | POA: Diagnosis not present

## 2020-10-31 LAB — RESP PANEL BY RT-PCR (FLU A&B, COVID) ARPGX2
Influenza A by PCR: NEGATIVE
Influenza B by PCR: NEGATIVE
SARS Coronavirus 2 by RT PCR: NEGATIVE

## 2020-10-31 LAB — TYPE AND SCREEN
ABO/RH(D): O POS
Antibody Screen: NEGATIVE

## 2020-10-31 LAB — CBC
HCT: 34.5 % — ABNORMAL LOW (ref 36.0–46.0)
Hemoglobin: 11.6 g/dL — ABNORMAL LOW (ref 12.0–15.0)
MCH: 30.9 pg (ref 26.0–34.0)
MCHC: 33.6 g/dL (ref 30.0–36.0)
MCV: 92 fL (ref 80.0–100.0)
Platelets: 166 10*3/uL (ref 150–400)
RBC: 3.75 MIL/uL — ABNORMAL LOW (ref 3.87–5.11)
RDW: 13.5 % (ref 11.5–15.5)
WBC: 10 10*3/uL (ref 4.0–10.5)
nRBC: 0 % (ref 0.0–0.2)

## 2020-10-31 LAB — RPR: RPR Ser Ql: NONREACTIVE

## 2020-10-31 LAB — POCT FERN TEST: POCT Fern Test: POSITIVE

## 2020-10-31 MED ORDER — SOD CITRATE-CITRIC ACID 500-334 MG/5ML PO SOLN
30.0000 mL | ORAL | Status: DC | PRN
  Administered 2020-11-01: 30 mL via ORAL
  Filled 2020-10-31: qty 15

## 2020-10-31 MED ORDER — LACTATED RINGERS IV SOLN: INTRAVENOUS | Status: DC

## 2020-10-31 MED ORDER — DIPHENHYDRAMINE HCL 50 MG/ML IJ SOLN
12.5000 mg | INTRAMUSCULAR | Status: DC | PRN
Start: 2020-10-31 — End: 2020-11-01

## 2020-10-31 MED ORDER — LIDOCAINE HCL (PF) 1 % IJ SOLN: 30.0000 mL | INTRAMUSCULAR | Status: DC | PRN

## 2020-10-31 MED ORDER — FLEET ENEMA 7-19 GM/118ML RE ENEM: 1.0000 | ENEMA | RECTAL | Status: DC | PRN

## 2020-10-31 MED ORDER — DIPHENHYDRAMINE HCL 50 MG/ML IJ SOLN: 12.5000 mg | INTRAMUSCULAR | Status: DC | PRN

## 2020-10-31 MED ORDER — EPHEDRINE 5 MG/ML INJ: 10.0000 mg | INTRAVENOUS | Status: DC | PRN

## 2020-10-31 MED ORDER — OXYTOCIN-SODIUM CHLORIDE 30-0.9 UT/500ML-% IV SOLN
1.0000 m[IU]/min | INTRAVENOUS | Status: DC
  Administered 2020-10-31: 22 m[IU]/min via INTRAVENOUS
  Administered 2020-10-31: 2 m[IU]/min via INTRAVENOUS
  Filled 2020-10-31: qty 500

## 2020-10-31 MED ORDER — LIDOCAINE HCL (PF) 1 % IJ SOLN
INTRAMUSCULAR | Status: DC | PRN
  Administered 2020-10-31: 6 mL via EPIDURAL

## 2020-10-31 MED ORDER — ONDANSETRON HCL 4 MG/2ML IJ SOLN
4.0000 mg | Freq: Four times a day (QID) | INTRAMUSCULAR | Status: DC | PRN
  Administered 2020-10-31: 4 mg via INTRAVENOUS
  Filled 2020-10-31: qty 2

## 2020-10-31 MED ORDER — LACTATED RINGERS IV SOLN: 500.0000 mL | Freq: Once | INTRAVENOUS | Status: DC

## 2020-10-31 MED ORDER — LACTATED RINGERS IV SOLN: 500.0000 mL | INTRAVENOUS | Status: DC | PRN

## 2020-10-31 MED ORDER — ACETAMINOPHEN 325 MG PO TABS
650.0000 mg | ORAL_TABLET | ORAL | Status: DC | PRN
  Administered 2020-11-01: 650 mg via ORAL
  Filled 2020-10-31: qty 2

## 2020-10-31 MED ORDER — FENTANYL-BUPIVACAINE-NACL 0.5-0.125-0.9 MG/250ML-% EP SOLN
EPIDURAL | Status: DC | PRN
  Administered 2020-10-31: 14 mL/h via EPIDURAL

## 2020-10-31 MED ORDER — FENTANYL-BUPIVACAINE-NACL 0.5-0.125-0.9 MG/250ML-% EP SOLN: 12.0000 mL/h | EPIDURAL | Status: DC | PRN

## 2020-10-31 MED ORDER — OXYCODONE-ACETAMINOPHEN 5-325 MG PO TABS: 1.0000 | ORAL_TABLET | ORAL | Status: DC | PRN

## 2020-10-31 MED ORDER — OXYCODONE-ACETAMINOPHEN 5-325 MG PO TABS
2.0000 | ORAL_TABLET | ORAL | Status: DC | PRN
Start: 2020-10-31 — End: 2020-11-01

## 2020-10-31 MED ORDER — TERBUTALINE SULFATE 1 MG/ML IJ SOLN: 0.2500 mg | Freq: Once | INTRAMUSCULAR | Status: DC | PRN

## 2020-10-31 MED ORDER — PHENYLEPHRINE 40 MCG/ML (10ML) SYRINGE FOR IV PUSH (FOR BLOOD PRESSURE SUPPORT): 80.0000 ug | PREFILLED_SYRINGE | INTRAVENOUS | Status: DC | PRN

## 2020-10-31 MED ORDER — OXYTOCIN-SODIUM CHLORIDE 30-0.9 UT/500ML-% IV SOLN
2.5000 [IU]/h | INTRAVENOUS | Status: DC
  Filled 2020-10-31: qty 500

## 2020-10-31 MED ORDER — OXYTOCIN BOLUS FROM INFUSION: 333.0000 mL | Freq: Once | INTRAVENOUS | Status: DC

## 2020-10-31 MED ORDER — FENTANYL-BUPIVACAINE-NACL 0.5-0.125-0.9 MG/250ML-% EP SOLN
12.0000 mL/h | EPIDURAL | Status: DC | PRN
  Administered 2020-11-01: 12 mL/h via EPIDURAL
  Filled 2020-10-31 (×2): qty 250

## 2020-10-31 NOTE — H&P (Signed)
Lauren Ashley is a 35 y.o. [id number] at [redacted]w[redacted]d presenting for rupture membranes. Pt notes no contractions . Good fetal movement, No vaginal bleeding, started leaking fluid at 2 AM.  [facility] -GBS negative -Prior SVD x2 -Advanced maternal age with normal nips   Prenatal Transfer Tool  Maternal Diabetes: No Genetic Screening: Normal Maternal Ultrasounds/Referrals: Normal Fetal Ultrasounds or other Referrals:  None Maternal Substance Abuse:  No Significant Maternal Medications:  None Significant Maternal Lab Results: Group B Strep negative     OB History    Gravida  3   Para  2   Term  2   Preterm      AB      Living  2     SAB      IAB      Ectopic      Multiple  0   Live Births  2          Past Medical History:  Diagnosis Date  . Medical history non-contributory    Past Surgical History:  Procedure Laterality Date  . EYE SURGERY    . TONSILLECTOMY    . WISDOM TOOTH EXTRACTION     Family History: family history includes Cancer in her father. Social History:  reports that she has never smoked. She has never used smokeless tobacco. She reports previous alcohol use. She reports that she does not use drugs.  Review of Systems - Negative except Rupture of membranes   Dilation: 1.5 Effacement (%): 40,50 Station: -3 Exam by:: Marcelle Overlie RN Blood pressure 128/70, pulse 99, temperature 98.2 F (36.8 C), temperature source Oral, resp. rate 18, height 5\' 2"  (1.575 m), weight 74.8 kg, unknown if currently breastfeeding.  Physical Exam:  Gen: well appearing, no distress  Abd: gravid, NT, no RUQ pain LE: Trace edema, equal bilaterally, non-tender Toco: Irritability FH: baseline 140s, accelerations present, no deceleratons, 10 beat variability  Prenatal labs: ABO, Rh: --/--/O POS (05/07 0430) Antibody: NEG (05/07 0430) Rubella:  Immune RPR:   Nonreactive HBsAg:  Negative negative HIV:   Negative GBS:    Negative 1 hr Glucola 123  Genetic screening normal nips Anatomy [birth year]1986 normal   Assessment/Plan: 35 y.o. G3P2002 at [redacted]w[redacted]d Rupture of membranes close to term.  Discussed with patient options for expectant management versus induction of labor with Pitocin.  Option for betamethasone however given patient so close to 37 weeks with larger baby and given risks of cortisone with delayed delivery recommend proceed with induction of labor with Pitocin.  Will start Pitocin 2 x 2.  Close attention to white count and fever  [redacted]w[redacted]d 10/31/2020 9:05 AM     12/31/2020 10/31/2020, 9:01 AM

## 2020-10-31 NOTE — MAU Note (Signed)
Pt reports water broke about 2am. Clear fluid. Denies any ctx at this time. Good fetal movement felt.

## 2020-10-31 NOTE — Anesthesia Preprocedure Evaluation (Signed)
Anesthesia Evaluation  Patient identified by MRN, date of birth, ID band Patient awake    Reviewed: Allergy & Precautions, H&P , NPO status , Patient's Chart, lab work & pertinent test results, reviewed documented beta blocker date and time   Airway Mallampati: I  TM Distance: >3 FB Neck ROM: full    Dental no notable dental hx. (+) Teeth Intact, Dental Advisory Given   Pulmonary neg pulmonary ROS,    Pulmonary exam normal breath sounds clear to auscultation       Cardiovascular negative cardio ROS Normal cardiovascular exam Rhythm:regular Rate:Normal     Neuro/Psych negative neurological ROS  negative psych ROS   GI/Hepatic negative GI ROS, Neg liver ROS,   Endo/Other  negative endocrine ROS  Renal/GU negative Renal ROS  negative genitourinary   Musculoskeletal   Abdominal   Peds  Hematology negative hematology ROS (+)   Anesthesia Other Findings   Reproductive/Obstetrics (+) Pregnancy                             Anesthesia Physical Anesthesia Plan  ASA: II  Anesthesia Plan: Epidural   Post-op Pain Management:    Induction:   PONV Risk Score and Plan: 2  Airway Management Planned: Natural Airway  Additional Equipment: None  Intra-op Plan:   Post-operative Plan:   Informed Consent: I have reviewed the patients History and Physical, chart, labs and discussed the procedure including the risks, benefits and alternatives for the proposed anesthesia with the patient or authorized representative who has indicated his/her understanding and acceptance.       Plan Discussed with: Anesthesiologist  Anesthesia Plan Comments:         Anesthesia Quick Evaluation

## 2020-10-31 NOTE — Progress Notes (Signed)
S: Doing well, no complaints, pain well controlled with epidural, not feeling pressure  O: BP (!) 100/56   Pulse 85   Temp 97.8 F (36.6 C) (Oral)   Resp 18   Ht 5\' 2"  (1.575 m)   Wt 74.8 kg   BMI 30.18 kg/m    FHT:  FHR: 120s bpm, variability: moderate,  accelerations:  Present,  decelerations:  Present possible intermittent late decels in timing, great variability within and HR drop only about 5-8 bpm, few variables UC:   regular, every 3 minutes SVE:   Dilation: 8 Effacement (%): 80 Station: 0 Exam by:: [email or web address], RN  Repeat exam by me now: 6-7/ 80%/ vtx -2  A / P:  35 y.o.  OB History  Gravida Para Term Preterm AB Living  3 2 2  0 0 2  SAB IAB Ectopic Multiple Live Births  0 0 0 0 2   at [redacted]w[redacted]d IOL after preterm PROM, slower than expected progress, continue pitocin  Fetal Wellbeing:  Category I and IUPC placed for further evaluation Pain Control:  Epidural  Anticipated MOD:  NSVD  10/31/2020, 8:49 PM

## 2020-10-31 NOTE — Anesthesia Procedure Notes (Signed)
Epidural Patient location during procedure: OB Start time: 10/31/2020 4:27 PM End time: 10/31/2020 4:30 PM  Staffing Anesthesiologist: Bethena Midget, MD  Preanesthetic Checklist Completed: patient identified, IV checked, site marked, risks and benefits discussed, surgical consent, monitors and equipment checked, pre-op evaluation and timeout performed  Epidural Patient position: sitting Prep: DuraPrep and site prepped and draped Patient monitoring: continuous pulse ox and blood pressure Approach: midline Location: L3-L4 Injection technique: LOR air  Needle:  Needle type: Tuohy  Needle gauge: 17 G Needle length: 9 cm and 9 Needle insertion depth: 5 cm cm Catheter type: closed end flexible Catheter size: 19 Gauge Catheter at skin depth: 10 cm Test dose: negative  Assessment Events: blood not aspirated, injection not painful, no injection resistance, no paresthesia and negative IV test

## 2020-11-01 ENCOUNTER — Encounter (HOSPITAL_COMMUNITY)
Admission: AD | Disposition: A | Discharge status: Home / Self Care | Payer: Self-pay | Source: Home / Self Care | Attending: Obstetrics

## 2020-11-01 ENCOUNTER — Encounter (HOSPITAL_COMMUNITY): Payer: Self-pay | Admitting: Obstetrics

## 2020-11-01 DIAGNOSIS — Z98891 History of uterine scar from previous surgery: Secondary | ICD-10-CM

## 2020-11-01 DIAGNOSIS — O42919 Preterm premature rupture of membranes, unspecified as to length of time between rupture and onset of labor, unspecified trimester: Secondary | ICD-10-CM | POA: Diagnosis present

## 2020-11-01 SURGERY — Surgical Case
Anesthesia: Epidural

## 2020-11-01 MED ORDER — DEXMEDETOMIDINE (PRECEDEX) IN NS 20 MCG/5ML (4 MCG/ML) IV SYRINGE
PREFILLED_SYRINGE | INTRAVENOUS | Status: AC
  Filled 2020-11-01: qty 10

## 2020-11-01 MED ORDER — NALBUPHINE HCL 10 MG/ML IJ SOLN: 5.0000 mg | Freq: Once | INTRAMUSCULAR | Status: DC | PRN

## 2020-11-01 MED ORDER — NALBUPHINE HCL 10 MG/ML IJ SOLN: 5.0000 mg | INTRAMUSCULAR | Status: DC | PRN

## 2020-11-01 MED ORDER — BUPIVACAINE HCL (PF) 0.25 % IJ SOLN
INTRAMUSCULAR | Status: AC
  Filled 2020-11-01: qty 10

## 2020-11-01 MED ORDER — TRANEXAMIC ACID-NACL 1000-0.7 MG/100ML-% IV SOLN
INTRAVENOUS | Status: DC | PRN
  Administered 2020-11-01: 1000 mg via INTRAVENOUS

## 2020-11-01 MED ORDER — LACTATED RINGERS IV SOLN: INTRAVENOUS | Status: DC | PRN

## 2020-11-01 MED ORDER — FENTANYL CITRATE (PF) 100 MCG/2ML IJ SOLN
INTRAMUSCULAR | Status: DC | PRN
  Administered 2020-11-01 (×2): 100 ug via EPIDURAL

## 2020-11-01 MED ORDER — TETANUS-DIPHTH-ACELL PERTUSSIS 5-2.5-18.5 LF-MCG/0.5 IM SUSY: 0.5000 mL | PREFILLED_SYRINGE | Freq: Once | INTRAMUSCULAR | Status: DC

## 2020-11-01 MED ORDER — LIDOCAINE 2% (20 MG/ML) 5 ML SYRINGE
INTRAMUSCULAR | Status: AC
  Filled 2020-11-01: qty 10

## 2020-11-01 MED ORDER — SCOPOLAMINE 1 MG/3DAYS TD PT72
MEDICATED_PATCH | TRANSDERMAL | Status: DC | PRN
  Administered 2020-11-01: 1 via TRANSDERMAL

## 2020-11-01 MED ORDER — SCOPOLAMINE 1 MG/3DAYS TD PT72: 1.0000 | MEDICATED_PATCH | Freq: Once | TRANSDERMAL | Status: DC

## 2020-11-01 MED ORDER — MORPHINE SULFATE (PF) 0.5 MG/ML IJ SOLN
INTRAMUSCULAR | Status: DC | PRN
  Administered 2020-11-01: 2 mg via INTRAVENOUS

## 2020-11-01 MED ORDER — OXYTOCIN-SODIUM CHLORIDE 30-0.9 UT/500ML-% IV SOLN
2.5000 [IU]/h | INTRAVENOUS | Status: AC
  Administered 2020-11-01: 2.5 [IU]/h via INTRAVENOUS

## 2020-11-01 MED ORDER — FENTANYL CITRATE (PF) 100 MCG/2ML IJ SOLN
INTRAMUSCULAR | Status: DC | PRN
  Administered 2020-11-01 (×2): 50 ug via INTRAVENOUS

## 2020-11-01 MED ORDER — KETOROLAC TROMETHAMINE 30 MG/ML IJ SOLN: 30.0000 mg | Freq: Four times a day (QID) | INTRAMUSCULAR | Status: AC | PRN

## 2020-11-01 MED ORDER — NALBUPHINE HCL 10 MG/ML IJ SOLN
5.0000 mg | Freq: Once | INTRAMUSCULAR | Status: DC | PRN
Start: 2020-11-01 — End: 2020-11-01

## 2020-11-01 MED ORDER — OXYTOCIN-SODIUM CHLORIDE 30-0.9 UT/500ML-% IV SOLN
INTRAVENOUS | Status: AC
  Filled 2020-11-01: qty 1000

## 2020-11-01 MED ORDER — KETOROLAC TROMETHAMINE 30 MG/ML IJ SOLN: 30.0000 mg | Freq: Four times a day (QID) | INTRAMUSCULAR | Status: DC | PRN

## 2020-11-01 MED ORDER — LACTATED RINGERS IV SOLN: INTRAVENOUS | Status: DC

## 2020-11-01 MED ORDER — DIPHENHYDRAMINE HCL 50 MG/ML IJ SOLN
12.5000 mg | INTRAMUSCULAR | Status: DC | PRN
Start: 2020-11-01 — End: 2020-11-01

## 2020-11-01 MED ORDER — FENTANYL CITRATE (PF) 100 MCG/2ML IJ SOLN
INTRAMUSCULAR | Status: AC
  Filled 2020-11-01: qty 2

## 2020-11-01 MED ORDER — ONDANSETRON HCL 4 MG/2ML IJ SOLN: 4.0000 mg | Freq: Three times a day (TID) | INTRAMUSCULAR | Status: DC | PRN

## 2020-11-01 MED ORDER — PROPOFOL 10 MG/ML IV BOLUS
INTRAVENOUS | Status: AC
  Filled 2020-11-01: qty 20

## 2020-11-01 MED ORDER — FENTANYL CITRATE (PF) 100 MCG/2ML IJ SOLN
INTRAMUSCULAR | Status: AC
  Administered 2020-11-01: 100 ug
  Filled 2020-11-01: qty 2

## 2020-11-01 MED ORDER — DIPHENHYDRAMINE HCL 25 MG PO CAPS
25.0000 mg | ORAL_CAPSULE | Freq: Four times a day (QID) | ORAL | Status: DC | PRN
  Administered 2020-11-02: 25 mg via ORAL
  Filled 2020-11-01: qty 1

## 2020-11-01 MED ORDER — KETOROLAC TROMETHAMINE 30 MG/ML IJ SOLN
30.0000 mg | Freq: Four times a day (QID) | INTRAMUSCULAR | Status: AC | PRN
  Administered 2020-11-01: 30 mg via INTRAVENOUS

## 2020-11-01 MED ORDER — FENTANYL CITRATE (PF) 100 MCG/2ML IJ SOLN
INTRAMUSCULAR | Status: DC | PRN
  Administered 2020-11-01: 100 ug via INTRAVENOUS

## 2020-11-01 MED ORDER — CEFAZOLIN SODIUM-DEXTROSE 2-4 GM/100ML-% IV SOLN
INTRAVENOUS | Status: AC
  Filled 2020-11-01: qty 100

## 2020-11-01 MED ORDER — SODIUM CHLORIDE 0.9 % IV SOLN: INTRAVENOUS | Status: DC | PRN

## 2020-11-01 MED ORDER — SENNOSIDES-DOCUSATE SODIUM 8.6-50 MG PO TABS
2.0000 | ORAL_TABLET | ORAL | Status: DC
  Administered 2020-11-01 – 2020-11-04 (×4): 2 via ORAL
  Filled 2020-11-01 (×4): qty 2

## 2020-11-01 MED ORDER — MORPHINE SULFATE (PF) 0.5 MG/ML IJ SOLN
INTRAMUSCULAR | Status: DC | PRN
  Administered 2020-11-01: 3 mg via EPIDURAL

## 2020-11-01 MED ORDER — NALOXONE HCL 4 MG/10ML IJ SOLN
1.0000 ug/kg/h | INTRAVENOUS | Status: DC | PRN
  Filled 2020-11-01: qty 5

## 2020-11-01 MED ORDER — ONDANSETRON HCL 4 MG/2ML IJ SOLN
INTRAMUSCULAR | Status: DC | PRN
  Administered 2020-11-01: 4 mg via INTRAVENOUS

## 2020-11-01 MED ORDER — NALOXONE HCL 0.4 MG/ML IJ SOLN: 0.4000 mg | INTRAMUSCULAR | Status: DC | PRN

## 2020-11-01 MED ORDER — LIDOCAINE-EPINEPHRINE (PF) 2 %-1:200000 IJ SOLN
INTRAMUSCULAR | Status: DC | PRN
  Administered 2020-11-01: 15 mL via EPIDURAL

## 2020-11-01 MED ORDER — OXYTOCIN-SODIUM CHLORIDE 30-0.9 UT/500ML-% IV SOLN
INTRAVENOUS | Status: DC | PRN
  Administered 2020-11-01 (×2): 30 [IU] via INTRAVENOUS

## 2020-11-01 MED ORDER — BUPIVACAINE HCL (PF) 0.25 % IJ SOLN
INTRAMUSCULAR | Status: DC | PRN
  Administered 2020-11-01: 8 mL via EPIDURAL

## 2020-11-01 MED ORDER — ONDANSETRON HCL 4 MG/2ML IJ SOLN
INTRAMUSCULAR | Status: AC
  Filled 2020-11-01: qty 2

## 2020-11-01 MED ORDER — PRENATAL MULTIVITAMIN CH
1.0000 | ORAL_TABLET | Freq: Every day | ORAL | Status: DC
  Administered 2020-11-01 – 2020-11-04 (×4): 1 via ORAL
  Filled 2020-11-01 (×4): qty 1

## 2020-11-01 MED ORDER — SIMETHICONE 80 MG PO CHEW
80.0000 mg | CHEWABLE_TABLET | Freq: Three times a day (TID) | ORAL | Status: DC
  Administered 2020-11-01 – 2020-11-04 (×10): 80 mg via ORAL
  Filled 2020-11-01 (×10): qty 1

## 2020-11-01 MED ORDER — SODIUM CHLORIDE 0.9% FLUSH: 3.0000 mL | INTRAVENOUS | Status: DC | PRN

## 2020-11-01 MED ORDER — SCOPOLAMINE 1 MG/3DAYS TD PT72
MEDICATED_PATCH | TRANSDERMAL | Status: AC
  Filled 2020-11-01: qty 1

## 2020-11-01 MED ORDER — TRANEXAMIC ACID-NACL 1000-0.7 MG/100ML-% IV SOLN
INTRAVENOUS | Status: AC
  Filled 2020-11-01: qty 100

## 2020-11-01 MED ORDER — KETOROLAC TROMETHAMINE 30 MG/ML IJ SOLN
30.0000 mg | Freq: Four times a day (QID) | INTRAMUSCULAR | Status: AC
  Administered 2020-11-01 – 2020-11-02 (×4): 30 mg via INTRAVENOUS
  Filled 2020-11-01 (×4): qty 1

## 2020-11-01 MED ORDER — METHYLERGONOVINE MALEATE 0.2 MG/ML IJ SOLN
INTRAMUSCULAR | Status: AC
  Filled 2020-11-01: qty 1

## 2020-11-01 MED ORDER — DIPHENHYDRAMINE HCL 25 MG PO CAPS: 25.0000 mg | ORAL_CAPSULE | ORAL | Status: DC | PRN

## 2020-11-01 MED ORDER — DIPHENHYDRAMINE HCL 50 MG/ML IJ SOLN: 12.5000 mg | INTRAMUSCULAR | Status: DC | PRN

## 2020-11-01 MED ORDER — ZOLPIDEM TARTRATE 5 MG PO TABS: 5.0000 mg | ORAL_TABLET | Freq: Every evening | ORAL | Status: DC | PRN

## 2020-11-01 MED ORDER — COCONUT OIL OIL: 1.0000 "application " | TOPICAL_OIL | Status: DC | PRN

## 2020-11-01 MED ORDER — METHYLERGONOVINE MALEATE 0.2 MG/ML IJ SOLN
INTRAMUSCULAR | Status: DC | PRN
  Administered 2020-11-01: .2 mg via INTRAMUSCULAR

## 2020-11-01 MED ORDER — CEFAZOLIN SODIUM-DEXTROSE 2-3 GM-%(50ML) IV SOLR
INTRAVENOUS | Status: DC | PRN
  Administered 2020-11-01: 2 g via INTRAVENOUS

## 2020-11-01 MED ORDER — MORPHINE SULFATE (PF) 0.5 MG/ML IJ SOLN
INTRAMUSCULAR | Status: AC
  Filled 2020-11-01: qty 10

## 2020-11-01 MED ORDER — OXYCODONE HCL 5 MG PO TABS
5.0000 mg | ORAL_TABLET | ORAL | Status: DC | PRN
  Administered 2020-11-02 – 2020-11-04 (×8): 5 mg via ORAL
  Filled 2020-11-01 (×8): qty 1

## 2020-11-01 MED ORDER — MENTHOL 3 MG MT LOZG: 1.0000 | LOZENGE | OROMUCOSAL | Status: DC | PRN

## 2020-11-01 MED ORDER — DIBUCAINE (PERIANAL) 1 % EX OINT: 1.0000 "application " | TOPICAL_OINTMENT | CUTANEOUS | Status: DC | PRN

## 2020-11-01 MED ORDER — SIMETHICONE 80 MG PO CHEW
80.0000 mg | CHEWABLE_TABLET | ORAL | Status: DC | PRN
  Administered 2020-11-02 – 2020-11-03 (×2): 80 mg via ORAL
  Filled 2020-11-01 (×2): qty 1

## 2020-11-01 MED ORDER — NALBUPHINE HCL 10 MG/ML IJ SOLN
5.0000 mg | INTRAMUSCULAR | Status: DC | PRN
Start: 2020-11-01 — End: 2020-11-01

## 2020-11-01 MED ORDER — WITCH HAZEL-GLYCERIN EX PADS
1.0000 | MEDICATED_PAD | CUTANEOUS | Status: DC | PRN
Start: 2020-11-01 — End: 2020-11-04

## 2020-11-01 MED ORDER — KETOROLAC TROMETHAMINE 30 MG/ML IJ SOLN
INTRAMUSCULAR | Status: AC
  Filled 2020-11-01: qty 1

## 2020-11-01 MED ORDER — DEXMEDETOMIDINE HCL 200 MCG/2ML IV SOLN
INTRAVENOUS | Status: DC | PRN
  Administered 2020-11-01: 8 ug via INTRAVENOUS
  Administered 2020-11-01: 4 ug via INTRAVENOUS
  Administered 2020-11-01: 8 ug via INTRAVENOUS

## 2020-11-01 MED ORDER — IBUPROFEN 600 MG PO TABS
600.0000 mg | ORAL_TABLET | Freq: Four times a day (QID) | ORAL | Status: DC
  Administered 2020-11-02 – 2020-11-04 (×8): 600 mg via ORAL
  Filled 2020-11-01 (×8): qty 1

## 2020-11-01 MED ORDER — ACETAMINOPHEN 500 MG PO TABS
1000.0000 mg | ORAL_TABLET | Freq: Four times a day (QID) | ORAL | Status: DC
  Administered 2020-11-01 – 2020-11-04 (×12): 1000 mg via ORAL
  Filled 2020-11-01 (×12): qty 2

## 2020-11-01 SURGICAL SUPPLY — 33 items
APL SKNCLS STERI-STRIP NONHPOA (GAUZE/BANDAGES/DRESSINGS) ×1
BENZOIN TINCTURE PRP APPL 2/3 (GAUZE/BANDAGES/DRESSINGS) ×2 IMPLANT
CHLORAPREP W/TINT 26ML (MISCELLANEOUS) ×2 IMPLANT
CLAMP CORD UMBIL (MISCELLANEOUS) IMPLANT
CLOTH BEACON ORANGE TIMEOUT ST (SAFETY) ×2 IMPLANT
DRSG OPSITE POSTOP 4X10 (GAUZE/BANDAGES/DRESSINGS) ×2 IMPLANT
ELECT REM PT RETURN 9FT ADLT (ELECTROSURGICAL) ×2
ELECTRODE REM PT RTRN 9FT ADLT (ELECTROSURGICAL) ×1 IMPLANT
EXTRACTOR VACUUM M CUP 4 TUBE (SUCTIONS) IMPLANT
GLOVE BIO SURGEON STRL SZ 6.5 (GLOVE) ×2 IMPLANT
GLOVE BIOGEL PI IND STRL 7.0 (GLOVE) ×2 IMPLANT
GLOVE BIOGEL PI INDICATOR 7.0 (GLOVE) ×2
GOWN STRL REUS W/TWL LRG LVL3 (GOWN DISPOSABLE) ×4 IMPLANT
KIT ABG SYR 3ML LUER SLIP (SYRINGE) IMPLANT
NEEDLE HYPO 22GX1.5 SAFETY (NEEDLE) IMPLANT
NEEDLE HYPO 25X5/8 SAFETYGLIDE (NEEDLE) IMPLANT
NS IRRIG 1000ML POUR BTL (IV SOLUTION) ×2 IMPLANT
PACK C SECTION WH (CUSTOM PROCEDURE TRAY) ×2 IMPLANT
PAD OB MATERNITY 4.3X12.25 (PERSONAL CARE ITEMS) ×2 IMPLANT
PENCIL SMOKE EVAC W/HOLSTER (ELECTROSURGICAL) ×2 IMPLANT
STRIP CLOSURE SKIN 1/2X4 (GAUZE/BANDAGES/DRESSINGS) ×2 IMPLANT
SUT MON AB 4-0 PS1 27 (SUTURE) ×2 IMPLANT
SUT PLAIN 0 NONE (SUTURE) IMPLANT
SUT PLAIN 2 0 XLH (SUTURE) IMPLANT
SUT VIC AB 0 CT1 36 (SUTURE) ×4 IMPLANT
SUT VIC AB 0 CTX 36 (SUTURE) ×4
SUT VIC AB 0 CTX36XBRD ANBCTRL (SUTURE) ×2 IMPLANT
SUT VIC AB 2-0 CT1 27 (SUTURE) ×2
SUT VIC AB 2-0 CT1 TAPERPNT 27 (SUTURE) ×1 IMPLANT
SYR CONTROL 10ML LL (SYRINGE) IMPLANT
TOWEL OR 17X24 6PK STRL BLUE (TOWEL DISPOSABLE) ×2 IMPLANT
TRAY FOLEY W/BAG SLVR 14FR LF (SET/KITS/TRAYS/PACK) IMPLANT
WATER STERILE IRR 1000ML POUR (IV SOLUTION) ×2 IMPLANT

## 2020-11-01 NOTE — Lactation Note (Signed)
This note was copied from a baby's chart. Lactation Consultation Note  Patient Name: Lauren Ashley VEH[facility]'N Date: 11/01/2020 Reason for consult: Initial assessment;Other (Comment);Late-preterm 34-36.6wks (hypoglycemia) Age:35 hours  Visited with mom of 5 hours old LPI female, she's a P3 and experienced BF. Baby's first blood glucose was at 38 mg.dL, LC spoon feed 3 ml of EBM (mom has great supply with Hx of oversupply). LC assisted with latching afterwards, baby latched easily but mom asked LC to take baby off the breast at the 5 minutes mark because she felt she was biting.  Baby had a non-nutritive sucking pattern; mom described it as "chomping" at the breast, she also mentioned that her 2nd baby never latched due to a frenulum, pediatrician to out rule possible oral restrictions; baby has a strong suck. Reviewed normal newborn behavior, cluster feeding, feeding cues, size of baby's stomach, pumping schedule, benefits of STS care and LPI policy.  Feeding plan:  1. Encouraged mom to feed baby STS 8-12 times/24 hours or sooner if feeding cues are present 2. She'll start pumping every 3 hours after feedings at the breast 3. Parents aware that baby needs to be fed q 3 hours to treat/prevent hypoglycemia and to follow LPI policy  BF brochure, BF resources, feeding diary and LPI handout were reviewed. FOB present and very supportive. Parents reported all questions and concerns were answered, they're both aware of LC OP services and will call PRN.  Maternal Data Has patient been taught Hand Expression?: Yes Does the patient have breastfeeding experience prior to this delivery?: Yes How long did the patient breastfeed?: # 1 for 17 months, # 2 for 6 months (but was fed breastmilk from mom's stash in the freezer until baby was 69 months old)  Feeding Mother's Current Feeding Choice: Breast Milk   Lactation Tools Discussed/Used Tools: Pump Breast pump type: Double-Electric Breast Pump Pump  Education: Setup, frequency, and cleaning;Milk Storage Reason for Pumping: LPI, hypoglycemia Pumping frequency: q 3 hours  Interventions Interventions: Breast feeding basics reviewed;DEBP;Breast massage;Hand express;Breast compression;Education  Discharge Pump: Manual;DEBP (Spectra and mom cozy DEBP at home) [facility] Program: Yes  Consult Status Consult Status: Follow-up Date: 11/02/20 Follow-up type: In-patient    Lauren Ashley 11/01/2020, 12:52 PM

## 2020-11-01 NOTE — Anesthesia Postprocedure Evaluation (Signed)
Anesthesia Post Note  Patient: Lauren Ashley  Procedure(s) Performed: CESAREAN SECTION (N/A )     Patient location during evaluation: PACU Anesthesia Type: Epidural Level of consciousness: oriented and awake and alert Pain management: pain level controlled Vital Signs Assessment: post-procedure vital signs reviewed and stable Respiratory status: spontaneous breathing, respiratory function stable and nonlabored ventilation Cardiovascular status: blood pressure returned to baseline and stable Postop Assessment: no headache, no backache, no apparent nausea or vomiting and epidural receding Anesthetic complications: no   No complications documented.  Last Vitals:  Vitals:   11/01/20 1059 11/01/20 1240  BP: 100/67 (!) 103/59  Pulse: 88 93  Resp: 15 18  Temp: 37.4 C 37.6 C  SpO2: 96% 97%    Last Pain:  Vitals:   11/01/20 1240  TempSrc: Oral  PainSc: 0-No pain   Pain Goal:                Epidural/Spinal Function Cutaneous sensation: Normal sensation (11/01/20 1240), Patient able to flex knees: Yes (11/01/20 1240), Patient able to lift hips off bed: Yes (11/01/20 1240), Back pain beyond tenderness at insertion site: No (11/01/20 1240), Progressively worsening motor and/or sensory loss: No (11/01/20 1240), Bowel and/or bladder incontinence post epidural: No (11/01/20 1240)  Lucretia Kern

## 2020-11-01 NOTE — Lactation Note (Signed)
This note was copied from a baby's chart. Lactation Consultation Note  Patient Name: Girl Steffie Waggoner YTKPT'W Date: 11/01/2020 Reason for consult: Follow-up assessment;Late-preterm 34-36.6wks Age:36 hours  LC back in to room per physician request due to low blood sugar.  Mother requests assistance with latch. Father woke up infant and checked diaper. LC set up support pillows for football position to right breast. Colostrum is easily expressed. Latched infant after a few attempts. Noted suckling and swallowing with stimulation. Infant breastfed for ~71minutes and fell asleep. LC hand expressed ~32mL and spoonfed infant. Infant awake and displaying hunger cues. LC assisted with latch, football position to right breast. Infant is suckling and swallowing rhythmically.    Plan:   1-Breastfeeding on demand, ensuring a deep, comfortable latch.  2-Undressing infant and place skin to skin when ready to breastfeed 3-Keep infant awake during breastfeeding session: massaging breast, infant's hand/shoulder/feet 4-Monitor voids and stools as signs good intake.  5-Supplement as needed with EBM hand-expressed or pumped.  6-Contact LC as needed for feeds/support/concerns/questions  Infant is still latched upon LC leaving room.   Feeding Mother's Current Feeding Choice: Breast Milk  LATCH Score Latch: Grasps breast easily, tongue down, lips flanged, rhythmical sucking.  Audible Swallowing: A few with stimulation  Type of Nipple: Everted at rest and after stimulation  Comfort (Breast/Nipple): Soft / non-tender  Hold (Positioning): Assistance needed to correctly position infant at breast and maintain latch.  LATCH Score: 8   Interventions Interventions: Assisted with latch;Breast massage;Hand express;Adjust position;Support pillows;Position options;Expressed milk;DEBP;Education  Consult Status Consult Status: Follow-up Date: 11/02/20 Follow-up type: In-patient    Chaquetta Schlottman A Higuera  Ancidey 11/01/2020, 7:12 PM

## 2020-11-01 NOTE — Op Note (Signed)
10/31/2020 - 11/01/2020  7:54 AM  PATIENT:  Lauren Ashley  35 y.o. female  PRE-OPERATIVE DIAGNOSIS:  Unscheduled Cesarean Section, See Delivery Summary Arrest of descent  POST-OPERATIVE DIAGNOSIS:  Unscheduled Cesarean Section, See Delivery Summary Arrest of descent  PROCEDURE:  Procedure(s): CESAREAN SECTION (N/A) LTCS/ PCS/ 2 layer closure  SURGEON:  Surgeon(s) and Role:    Noland Fordyce, MD - Primary  PHYSICIAN ASSISTANT: none  ASSISTANTS: surgical tech   ANESTHESIA:   local and epidural  EBL:  693 plus about additional 100cc blood/ clot from vagina at end of case  BLOOD ADMINISTERED:none  DRAINS: Urinary Catheter (Foley)   LOCAL MEDICATIONS USED:  LIDOCAINE  and Amount: 10 cc mixed with 20 cc NS. 2/% plain lidocaine ml  SPECIMEN:  Source of Specimen:  placenta  DISPOSITION OF SPECIMEN:  L&D for disposal  COUNTS:  YES  TOURNIQUET:  * No tourniquets in log *  DICTATION: .Note written in EPIC  PLAN OF CARE: Admit to inpatient   PATIENT DISPOSITION:  PACU - hemodynamically stable.   Delay start of Pharmacological VTE agent (>24hrs) due to surgical blood loss or risk of bleeding: yes     Findings:  @BABYSEXEBC @ infant,  APGAR (1 MIN): 8   APGAR (5 MINS): 9   APGAR (10 MINS):   Normal uterus, tubes and ovaries, normal placenta. 3VC, clear amniotic fluid  EBL: 693 cc Antibiotics:   2g Ancef Complications: none  Indications: This is a 35 y.o. year-old, G3P2  At [redacted]w[redacted]d admitted for SROM PROM at 36'5. Decision made to proceed with IOL. Pt had pitocin IOL with very slow progress. Made to 8 cm then slowly to 10 over about 6 hrs. IUPC was in place with adequate contractions. Fetal testing always remained reaussring. Pt allowed to passively descent for 1-2 hrs then pushed for 1+ hours with no descent. Hematuria noted, suspected presistent OP. Pt asking for c/s. Risks benefits and alternatives of the procedure were discussed with the patient who agreed to  proceed  Procedure:  After informed consent was obtained the patient was taken to the operating room where epidural anesthesia was adequate but pt still complaining of pain. Lidocaine placed into skin and pt tolerated initial surgery well. [redacted]w[redacted]d  She was prepped and draped in the normal sterile fashion in dorsal supine position with a leftward tilt.  A foley catheter was in place and draining pink tinged urine as it had been for the last several hours.  A Pfannenstiel skin incision was made 2 cm above the pubic symphysis in the midline with the scalpel.  Dissection was carried down with the Bovie cautery until the fascia was reached. The fascia was incised in the midline. The incision was extended laterally with the Mayo scissors. The inferior aspect of the fascial incision was grasped with the Coker clamps, elevated up and the underlying rectus muscles were dissected off sharply. The superior aspect of the fascial incision was grasped with the Coker clamps elevated up and the underlying rectus muscles were dissected off sharply.  The peritoneum was entered bluntly. The peritoneal incision was extended superiorly and inferiorly with good visualization of the bladder. The bladder blade was inserted and palpation was done to assess the fetal position and the location of the uterine vessels. Baby was known to be wedged in pelvis. RN placed hand in vagina to elevate head. The lower segment of the uterus was incised sharply with the scalpel and extended  bluntly in the cephalo-caudal fashion. The infant  was grasped, brought to the incision,  rotated and the infant was delivered with fundal pressure. The nose and mouth were bulb suctioned. The cord was clamped and cut after 1 minute delay. The infant was handed off to the waiting pediatrician. The placenta was expressed. The uterus was unable to be exteriorized and left in situ. Several venous sinuses and arterial bleeds were noticed on the hysterotomy and T clamps were  used to control bleeding. TXA was given. The uterus was cleared of all clots and debris. The uterine incision was repaired with 0 Vicryl in a running locked fashion.  A second layer of the same suture was used in an imbricating fashion to obtain excellent hemostasis. Several additional sutures were placed in the left interior incision for bleeding. Pt was having significant pain during closure of the hysterotomy and additional IV meds were given. The tubes and ovaries were evaluated and normal. The uterine incision was reinspected and found to be hemostatic. The peritoneum was grasped and closed with 2-0 Vicryl in a running fashion. The cut muscle edges and the underside of the fascia were inspected and found to be hemostatic. The fascia was closed with 0 Vicryl in a single layer . The subcutaneous tissue was irrigated. Scarpa's layer was closed with a 2-0 plain gut suture. The skin was closed with a 4-0 Monocryl in a single layer. The patient tolerated the procedure well. Sponge lap and needle counts were correct x3. Once drapes were removed I did a vaginal exam due to pt's complaint of vaginal pain and found no abnormalities other than expected edema. Patient was taken to the recovery room in a stable condition.  Lendon Colonel 11/01/2020 7:57 AM

## 2020-11-01 NOTE — Brief Op Note (Signed)
10/31/2020 - 11/01/2020  7:54 AM  PATIENT:  Lauren Ashley  35 y.o. female  PRE-OPERATIVE DIAGNOSIS:  Unscheduled Cesarean Section, See Delivery Summary Arrest of descent  POST-OPERATIVE DIAGNOSIS:  Unscheduled Cesarean Section, See Delivery Summary Arrest of descent  PROCEDURE:  Procedure(s): CESAREAN SECTION (N/A) LTCS/ PCS/ 2 layer closure  SURGEON:  Surgeon(s) and Role:    Noland Fordyce, MD - Primary  PHYSICIAN ASSISTANT: none  ASSISTANTS: surgical tech   ANESTHESIA:   local and epidural  EBL:  693   BLOOD ADMINISTERED:none  DRAINS: Urinary Catheter (Foley)   LOCAL MEDICATIONS USED:  LIDOCAINE  and Amount: 10 cc mixed with 20 cc NS. 2/% plain lidocaine ml  SPECIMEN:  Source of Specimen:  placenta  DISPOSITION OF SPECIMEN:  L&D for disposal  COUNTS:  YES  TOURNIQUET:  * No tourniquets in log *  DICTATION: .Note written in EPIC  PLAN OF CARE: Admit to inpatient   PATIENT DISPOSITION:  PACU - hemodynamically stable.   Delay start of Pharmacological VTE agent (>24hrs) due to surgical blood loss or risk of bleeding: yes

## 2020-11-01 NOTE — Lactation Note (Signed)
This note was copied from a baby's chart. Lactation Consultation Note Mom worried baby isn't getting anything when she BF. Mom stated she couldn't hear swallows. Discussed consistency of colostrum and on the first day to focus on the baby learning to BF well and bonding w/baby. Mom had concerns d/t baby having low blood sugars. Mom pumped and gave baby 1 ml colostrum. Mom had hx w/last child of forceful let down and mom pumped for 6 months having a large supply of milk.  Patient Name: Lauren Ashley HQION'G Date: 11/01/2020 Reason for consult: Mother's request;Late-preterm 34-36.6wks Age:60 hours  Maternal Data Has patient been taught Hand Expression?: Yes  Feeding Mother's Current Feeding Choice: Breast Milk  LATCH Score Latch: Grasps breast easily, tongue down, lips flanged, rhythmical sucking.  Audible Swallowing: A few with stimulation  Type of Nipple: Everted at rest and after stimulation  Comfort (Breast/Nipple): Soft / non-tender  Hold (Positioning): Assistance needed to correctly position infant at breast and maintain latch.  LATCH Score: 8   Lactation Tools Discussed/Used    Interventions Interventions: Breast feeding basics reviewed;Expressed milk  Discharge    Consult Status Consult Status: Follow-up Date: 11/02/20 Follow-up type: In-patient    Charyl Dancer 11/01/2020, 10:47 PM

## 2020-11-01 NOTE — Transfer of Care (Signed)
Immediate Anesthesia Transfer of Care Note  Patient: Lauren Ashley  Procedure(s) Performed: CESAREAN SECTION (N/A )  Patient Location: PACU  Anesthesia Type:Epidural  Level of Consciousness: awake, alert  and oriented  Airway & Oxygen Therapy: Patient Spontanous Breathing and Patient connected to nasal cannula oxygen  Post-op Assessment: Report given to RN and Post -op Vital signs reviewed and stable  Post vital signs: Reviewed and stable  Last Vitals:  Vitals Value Taken Time  BP 110/71 11/01/20 0815  Temp    Pulse 96 11/01/20 0825  Resp 21 11/01/20 0825  SpO2 96 % 11/01/20 0825  Vitals shown include unvalidated device data.  Last Pain:  Vitals:   11/01/20 0500  TempSrc: Oral  PainSc:          Complications: No complications documented.

## 2020-11-02 LAB — CBC
HCT: 23.4 % — ABNORMAL LOW (ref 36.0–46.0)
Hemoglobin: 7.8 g/dL — ABNORMAL LOW (ref 12.0–15.0)
MCH: 31.3 pg (ref 26.0–34.0)
MCHC: 33.3 g/dL (ref 30.0–36.0)
MCV: 94 fL (ref 80.0–100.0)
Platelets: 138 10*3/uL — ABNORMAL LOW (ref 150–400)
RBC: 2.49 MIL/uL — ABNORMAL LOW (ref 3.87–5.11)
RDW: 14.1 % (ref 11.5–15.5)
WBC: 14.6 10*3/uL — ABNORMAL HIGH (ref 4.0–10.5)
nRBC: 0 % (ref 0.0–0.2)

## 2020-11-02 MED ORDER — SODIUM CHLORIDE 0.9 % IV SOLN
500.0000 mg | Freq: Once | INTRAVENOUS | Status: AC
  Administered 2020-11-02: 500 mg via INTRAVENOUS
  Filled 2020-11-02: qty 25

## 2020-11-02 NOTE — Lactation Note (Signed)
This note was copied from a baby's chart. Lactation Consultation Note  Patient Name: Lauren Ashley [id number]'M Date: 11/02/2020 Reason for consult: Follow-up assessment;Late-preterm 34-36.6wks;Infant weight loss Age:36 hours   P3 mother whose infant is now 92 hours old.  This is a LPTI at 36+6 with a CGA of 37+0 weeks.  Baby had a 4% weight loss in the first 24 hours of life.    Mother originally was not interested in supplementing, however, today I asked her to begin supplementation: educated on the benefits of supplementation and mother is willing to do so. Baby had recently breast fed.  Obtained a signed breast milk consent form and RN will provide breast milk labels.  Reviewed the supplementation guidelines and demonstrated paced bottle feeding.  Baby consumed 10 mls of donor milk with the purple extra slow flow nipple easily.  Burped well and placed STS on mother's chest where she fell asleep.  Mother will continue feeding at least every three hours or sooner as baby desires.  She will supplement with donor milk and continue to post pump for 15 minutes.  Reminded mother to feed back any EBM she is able to obtain with hand expression and pumping.  At this time mother has a couple of colostrum drops which I finger fed to baby.  Mother has a DEBP for home use.  Family member present.  She will call for assistance as needed.  RN updated.   Maternal Data Has patient been taught Hand Expression?: Yes Does the patient have breastfeeding experience prior to this delivery?: Yes How long did the patient breastfeed?: 17 months with first child and 6 months with second child  Feeding Mother's Current Feeding Choice: Breast Milk and Donor Milk  LATCH Score                    Lactation Tools Discussed/Used Tools: Shells;Pump;Flanges;Coconut oil Flange Size: 24;27 Breast pump type: Double-Electric Breast Pump;Manual Reason for Pumping: Breast stimulation and supplementation for  LPTI Pumping frequency: Every three hours Pumped volume: 1 mL  Interventions Interventions: Education  Discharge Pump: DEBP;Manual;Personal WIC Program: Yes  Consult Status Consult Status: Follow-up Date: 11/03/20 Follow-up type: In-patient    Zyionna Pesce R Reegan Bouffard 11/02/2020, 11:30 AM

## 2020-11-02 NOTE — Progress Notes (Signed)
POSTOPERATIVE DAY # 1 S/P Primary LTCS for arrest of descent, PPROM, baby girl not named yet    S:         Reports feeling much better than yesterday             Tolerating po intake / no nausea / no vomiting / no flatus / no BM  Denies dizziness, SOB, or CP             Bleeding is light             Pain controlled with Toradol and Tylenol             Up ad lib / ambulatory/ voiding QS without difficulty x3  Newborn breast feeding    O:  VS: BP (!) 92/46 (BP Location: Right Arm)   Pulse 77   Temp 97.9 F (36.6 C) (Oral)   Resp 17   Ht 5\' 2"  (1.575 m)   Wt 74.8 kg   SpO2 99%   Breastfeeding Unknown   BMI 30.18 kg/m   Patient Vitals for the past 24 hrs:  BP Temp Temp src Pulse Resp SpO2  11/02/20 0606 (!) 92/46 97.9 F (36.6 C) Oral 77 17 99 %  11/02/20 0304 (!) 92/53 -- Axillary 85 16 99 %  11/01/20 2140 93/61 -- -- 86 -- --  11/01/20 2131 (!) 86/56 98.7 F (37.1 C) Oral 88 16 99 %  11/01/20 1805 (!) 86/59 98.6 F (37 C) Oral 93 15 98 %  11/01/20 1644 97/62 98.9 F (37.2 C) Oral 90 -- 98 %  11/01/20 1400 -- -- -- -- -- 99 %  11/01/20 1240 (!) 103/59 99.6 F (37.6 C) Oral 93 18 97 %  11/01/20 1059 100/67 99.3 F (37.4 C) Oral 88 15 96 %     LABS:              Recent Labs    10/31/20 0436 11/02/20 0441  WBC 10.0 14.6*  HGB 11.6* 7.8*  PLT 166 138*               Bloodtype: --/--/O POS (05/07 0430)  Rubella:     Immune                                          I&O: Intake/Output      05/08 0701 05/09 0700 05/09 0701 05/10 0700   I.V. (mL/kg) 2000 (26.7)    Total Intake(mL/kg) 2000 (26.7)    Urine (mL/kg/hr) 2100 (1.2)    Emesis/NG output     Blood 793    Total Output 2893    Net -893                      Physical Exam:             Alert and Oriented X3  Lungs: Clear and unlabored  Heart: regular rate and rhythm / no murmurs  Abdomen: soft, non-tender, moderate gaseous distention, active bowel sounds              Fundus: firm, non-tender, below  umbilicus             Dressing: honeycomb with steir-strips c/d/i              Incision:  approximated with sutures / no erythema / no ecchymosis / no drainage  Perineum: intact  Lochia: appropriate  Extremities: +2 pedal/LE edema, no calf pain or tenderness  A/P:     POD # 1 S/P Primary LTCS            ABL Anemia    - Venofer IV iron x 1 dose   - Premedicate with Benadryl 25mg  x 1 dose    - Begin oral Niferex 150 mg PO and Magnesium oxide 400mg  PO daily tomorrow   Routine postoperative care              Warm liquids and ambulation to promote bowel motility   , MSN, CNM [facility]

## 2020-11-02 NOTE — Lactation Note (Signed)
This note was copied from a baby's chart. Lactation Consultation Note Baby 20 hrs old. Mom called LC to come and help mom hand express. Mom stated she has trouble w/hand expression, she can't get as much as LC can.  [redacted] week gestation baby feeding habits, behavior, STS, I&O, breast massage during feedings, pumping, supplementing d/t LPI. Reviewed LPI information sheet.  Suggested mom pump q 3hrs after feeding, let FOB give EBM while mom pumps. Mom is worried about not knowing how much baby is getting. Mom did feel her breast for transfer and it was softer.   Mom had issues with her last child of forceful letdown. Mom remember pumping so much volume, its hard for mom to remember starting over at the newborn phase. Praised mom for all of her hard work. Reinforced to mom she is doing a great job and things are going well.  Patient Name: Girl Rosabella Edgin AJOIN'O Date: 11/02/2020 Reason for consult: Mother's request;Late-preterm 34-36.6wks Age:36 hours  Maternal Data Has patient been taught Hand Expression?: Yes  Feeding    LATCH Score Latch: Grasps breast easily, tongue down, lips flanged, rhythmical sucking.  Audible Swallowing: A few with stimulation  Type of Nipple: Everted at rest and after stimulation  Comfort (Breast/Nipple): Soft / non-tender  Hold (Positioning): Assistance needed to correctly position infant at breast and maintain latch.  LATCH Score: 8   Lactation Tools Discussed/Used    Interventions Interventions: Breast feeding basics reviewed;Support pillows;Assisted with latch;Position options;Skin to skin;Expressed milk;Breast massage;Hand express;Breast compression;Adjust position  Discharge    Consult Status Consult Status: Follow-up Date: 11/02/20 Follow-up type: In-patient    Jenni Thew, Diamond Nickel 11/02/2020, 3:09 AM

## 2020-11-03 DIAGNOSIS — R609 Edema, unspecified: Secondary | ICD-10-CM | POA: Diagnosis not present

## 2020-11-03 DIAGNOSIS — D62 Acute posthemorrhagic anemia: Secondary | ICD-10-CM | POA: Diagnosis not present

## 2020-11-03 MED ORDER — HYDROCHLOROTHIAZIDE 25 MG PO TABS
25.0000 mg | ORAL_TABLET | Freq: Every day | ORAL | Status: DC
  Administered 2020-11-03 – 2020-11-04 (×2): 25 mg via ORAL
  Filled 2020-11-03 (×2): qty 1

## 2020-11-03 NOTE — Progress Notes (Signed)
Subjective: POD# 2 Live born female  Birth Weight: 7 lb 5.1 oz (3320 g) APGAR: 8, 9  Newborn Delivery   Birth date/time: 11/01/2020 07:09:00 Delivery type: C-Section, Low Transverse Trial of labor: Yes C-section categorization: Primary     Baby name: Javier Glazier Delivering provider: Noland Fordyce   Feeding: breast and bottle  Pain control at delivery: Epidural   Reports feeling well overall. Pain is well controlled with PO meds. Bleeding is minimal. Desires discharge today but baby will remain inpatient. Dyad will remain inpatient. Reports significant lower extremity edema that is an increasingly bothersome. Discussed HCTZ for 5 days and patient agrees with plan.   Patient reports tolerating PO.   Pain controlled with acetaminophen, ibuprofen (OTC) and narcotic analgesics including Oxy IR Denies HA/SOB/C/P/N/V/dizziness. She reports vaginal bleeding as normal, without clots. She is ambulating and urinating without difficulty.     Objective:   Vitals:   11/02/20 0606 11/02/20 1945 11/03/20 0457 11/03/20 1352  BP: (!) 92/46 106/72 101/62 98/66  Pulse: 77 63 96 97  Resp: 17 18 16 18   Temp: 97.9 F (36.6 C) 98.8 F (37.1 C) 98.2 F (36.8 C) 97.8 F (36.6 C)  TempSrc: Oral Oral Oral Oral  SpO2: 99%   99%  Weight:      Height:        No intake or output data in the 24 hours ending 11/03/20 1359      Recent Labs    11/02/20 0441  WBC 14.6*  HGB 7.8*  HCT 23.4*  PLT 138*     Blood type: --/--/O POS (05/07 0430)  Rubella:   Immune  Vaccines:   TDaP          UTD                       Physical Exam:  General: alert and cooperative CV: Regular rate and rhythm Resp: clear Abdomen: soft, nontender, normal bowel sounds Incision: clean, dry and intact Uterine Fundus: firm, below umbilicus, nontender Lochia: minimal and moderate Ext: extremities normal, atraumatic, no cyanosis or edema and edema 2+ non-pitting to lower extremities  Assessment/Plan: 35 y.o.    POD# 2. 36                  Principal Problem:   Postpartum care following cesarean delivery (5/8)  Encourage rest when baby rests  Breastfeeding support  Encourage to ambulate  Routine post-op care Active Problems:   Indication for care in labor or delivery   S/P cesarean section: arrest of descent    Preterm premature rupture of membranes   Anemia associated with acute blood loss  Asymptomatic  S/P IV Venofer yesterday  Start PO Niferex and mag oxide today   Dependent edema  HCTZ 25mg  x 5 days  Anticipate discharge tomorrow.   09-14-1997, CNM, MSN 11/03/2020, 1:59 PM

## 2020-11-04 MED ORDER — ACETAMINOPHEN 500 MG PO TABS: 1000.0000 mg | ORAL_TABLET | Freq: Four times a day (QID) | ORAL | 0 refills | Status: AC

## 2020-11-04 MED ORDER — OXYCODONE HCL 5 MG PO TABS: 5.0000 mg | ORAL_TABLET | ORAL | 0 refills | Status: AC | PRN

## 2020-11-04 MED ORDER — POLYSACCHARIDE IRON COMPLEX 150 MG PO CAPS: 150.0000 mg | ORAL_CAPSULE | Freq: Every day | ORAL | Status: DC

## 2020-11-04 MED ORDER — COCONUT OIL OIL
1.0000 | TOPICAL_OIL | 0 refills | Status: AC | PRN
Start: 2020-11-04 — End: ?

## 2020-11-04 MED ORDER — HYDROCHLOROTHIAZIDE 25 MG PO TABS: 25.0000 mg | ORAL_TABLET | Freq: Every day | ORAL | 0 refills | Status: AC

## 2020-11-04 MED ORDER — MAGNESIUM OXIDE -MG SUPPLEMENT 400 (240 MG) MG PO TABS: 400.0000 mg | ORAL_TABLET | Freq: Every day | ORAL | Status: AC

## 2020-11-04 MED ORDER — HYDROCHLOROTHIAZIDE 12.5 MG PO CAPS: 12.5000 mg | ORAL_CAPSULE | Freq: Every day | ORAL | 0 refills | Status: DC

## 2020-11-04 MED ORDER — IBUPROFEN 600 MG PO TABS: 600.0000 mg | ORAL_TABLET | Freq: Four times a day (QID) | ORAL | 0 refills | Status: AC

## 2020-11-04 NOTE — Lactation Note (Addendum)
This note was copied from a baby's chart. Lactation Consultation Note  Patient Name: Lauren Ashley EXBMW'U Date: 11/04/2020 Reason for consult: Follow-up assessment;Late-preterm 34-36.6wks Age:36 hours   P3 mother whose infant is now 16 hours old.  This is a LPTI at 36+6 weeks with a CGA of 37+2 weeks.  Baby has a 7% weight loss this morning. Bilirubin is 13 mg/dl at 72 hours (low intermediate risk zone).  Mother had just finished breast feeding on one breast when I arrived and she had her latched to the second breast.  Baby appeared to be latched correctly even though she was sleepy at this time.  Demonstrated gentle stimulation and baby aroused to continue feeding for a few more minutes.  Reviewed nutritive vs non-nutritive sucking.  Suggested mother continue to supplement after every feeding with 30+ mls/feeding.  She will also continue to pump after feedings.  At this time mother has 15 mls of EBM in the refrigerator.  Warmed the milk and father will feed this back to baby.  Mother had no questions/concerns related to breast feeding and has experience with her other children.  Mother has our OP phone number and the OP LC phone number for concerns or follow up visits as needed.  She has a DEBP for home use.  Father present and supportive.   Maternal Data    Feeding    LATCH Score Latch: Grasps breast easily, tongue down, lips flanged, rhythmical sucking.  Audible Swallowing: A few with stimulation  Type of Nipple: Everted at rest and after stimulation  Comfort (Breast/Nipple): Filling, red/small blisters or bruises, mild/mod discomfort  Hold (Positioning): No assistance needed to correctly position infant at breast.  LATCH Score: 8   Lactation Tools Discussed/Used    Interventions Interventions: Education  Discharge Discharge Education: Engorgement and breast care Pump: DEBP;Manual;Personal  Consult Status Consult Status: Complete Date: 11/04/20 Follow-up type:  Call as needed    Lauren Ashley 11/04/2020, 9:08 AM

## 2020-11-04 NOTE — Discharge Instructions (Signed)
Lactation outpatient support - home visit  Linda Coppola RN, MHA, IBCLC at [facility]: Lactation Consultant  [email or web address] Mail: [email or web address] Tel: [phone]    Additional resources:  [facility] [email or web address]   Chiropractic specialist   Dr. Leanna Hastings [email or web address]  Craniosacral therapy for baby  Erin Balkind  [email or web address]  

## 2020-11-04 NOTE — Discharge Summary (Signed)
OB Discharge Summary  Patient Name: Lauren Ashley DOB: [birth year] MRN: [id number]  Date of admission: 10/31/2020 Delivering provider: Noland Fordyce   Admitting diagnosis: Indication for care in labor or delivery [O75.9] Intrauterine pregnancy: [redacted]w[redacted]d     Secondary diagnosis: Patient Active Problem List   Diagnosis Date Noted  . Anemia associated with acute blood loss 11/03/2020  . Dependent edema 11/03/2020  . Postpartum care following cesarean delivery (5/8) 11/01/2020  . S/P cesarean section: arrest of descent  11/01/2020  . Preterm premature rupture of membranes 11/01/2020  . Indication for care in labor or delivery 10/31/2020   Additional problems:none   Date of discharge: 11/04/2020   Discharge diagnosis: Principal Problem:   Postpartum care following cesarean delivery (5/8) Active Problems:   Indication for care in labor or delivery   S/P cesarean section: arrest of descent    Preterm premature rupture of membranes   Anemia associated with acute blood loss   Dependent edema                                                              Post partum procedures:IV Venofer  Augmentation: Pitocin and N/A Pain control: Epidural  Laceration:None  Episiotomy:None  Complications: None  Hospital course:  Onset of Labor With Unplanned C/S   35 y.o. yo [id number] at [redacted]w[redacted]d was admitted in Latent Labor with Preterm PROM on 10/31/2020. Patient had a labor course significant of slow progression augmentation of labor with pitocin. Got to complete and pushed for 1+ hours with no fetal descent.The patient went for cesarean section due to Arrest of Descent. Delivery details as follows: Membrane Rupture Time/Date: 2:00 AM ,10/30/2020   Delivery Method:C-Section, Low Transverse  Details of operation can be found in separate operative note. Patient had an uncomplicated postpartum course.  She is ambulating,tolerating a regular diet, passing flatus, and urinating well.  Patient is discharged  home in stable condition 11/04/20.  Newborn Data: Birth date:11/01/2020  Birth time:7:09 AM  Gender:Female  Living status:Living  Apgars:8 ,9  Weight:3320 g   Physical exam  Vitals:   11/03/20 0457 11/03/20 1352 11/03/20 1955 11/04/20 0544  BP: 101/62 98/66 106/68 106/62  Pulse: 96 97 88 92  Resp: 16 18 18 18   Temp: 98.2 F (36.8 C) 97.8 F (36.6 C) 98.7 F (37.1 C) 98.4 F (36.9 C)  TempSrc: Oral Oral Oral Oral  SpO2:  99% 99%   Weight:      Height:       General: alert, cooperative and no distress Lochia: minimal without clots Uterine Fundus: firm below umbilicus Incision: Healing well with no significant drainage Perineum: intact with no edema DVT Evaluation: no erythema or edema. No signs of DVT.  Labs: Lab Results  Component Value Date   WBC 14.6 (H) 11/02/2020   HGB 7.8 (L) 11/02/2020   HCT 23.4 (L) 11/02/2020   MCV 94.0 11/02/2020   PLT 138 (L) 11/02/2020   No flowsheet data found. Edinburgh Postnatal Depression Scale Screening Tool 11/03/2020 03/01/2019 02/28/2019  I have been able to laugh and see the funny side of things. 0 0 0  I have looked forward with enjoyment to things. 0 0 0  I have blamed myself unnecessarily when things went wrong. 0 0 0  I have been anxious or  worried for no good reason. 0 0 0  I have felt scared or panicky for no good reason. 0 0 0  Things have been getting on top of me. 0 0 0  I have been so unhappy that I have had difficulty sleeping. 0 0 0  I have felt sad or miserable. 0 0 0  I have been so unhappy that I have been crying. 0 0 0  The thought of harming myself has occurred to me. 0 0 0  Edinburgh Postnatal Depression Scale Total 0 0 0   Vaccines: TDaP          UTD  Discharge instruction:  per After Visit Summary,  [facility] booklet and  "Understanding Mother & Baby Care" hospital booklet  After Visit Meds:  Allergies as of 11/04/2020      Reactions   Erythromycin Nausea And Vomiting   Penicillins Nausea And Vomiting       Medication List    TAKE these medications   acetaminophen 500 MG tablet Commonly known as: TYLENOL Take 2 tablets (1,000 mg total) by mouth every 6 (six) hours.   coconut oil Oil Apply 1 application topically as needed.   famotidine 20 MG tablet Commonly known as: PEPCID Take 20 mg by mouth 2 (two) times daily.   hydrochlorothiazide 25 MG tablet Commonly known as: HYDRODIURIL Take 1 tablet (25 mg total) by mouth daily for 5 days.   hydrochlorothiazide 12.5 MG capsule Commonly known as: Microzide Take 1 capsule (12.5 mg total) by mouth daily for 4 days.   ibuprofen 600 MG tablet Commonly known as: ADVIL Take 1 tablet (600 mg total) by mouth every 6 (six) hours.   iron polysaccharides 150 MG capsule Commonly known as: Ferrex 150 Take 1 capsule (150 mg total) by mouth daily.   magnesium oxide 400 (240 Mg) MG tablet Commonly known as: MAG-OX Take 1 tablet (400 mg total) by mouth daily. For prevention of constipation.   oxyCODONE 5 MG immediate release tablet Commonly known as: Oxy IR/ROXICODONE Take 1-2 tablets (5-10 mg total) by mouth every 4 (four) hours as needed for moderate pain.   PRENATAL ADULT GUMMY/DHA/FA PO Take 2 each by mouth daily.       Diet: routine diet  Activity: Advance as tolerated. Pelvic rest for 6 weeks.   Postpartum contraception: Not Discussed  Newborn Data: Live born female  Birth Weight: 7 lb 5.1 oz (3320 g) APGAR: 8, 9  Newborn Delivery   Birth date/time: 11/01/2020 07:09:00 Delivery type: C-Section, Low Transverse Trial of labor: Yes C-section categorization: Primary      named Javier Glazier Baby Feeding: Bottle and Breast Disposition:home with mother   Delivery Report:  Review the Delivery Report for details.    Follow up:  Follow-up Information    Olivia Mackie, MD. Schedule an appointment as soon as possible for a visit in 6 week(s).   Specialty: Obstetrics and Gynecology Contact information: [address] [place] [address] [phone]                Batool Majid Danella Deis) Suzie Portela, BSN, RNC-OB  Student Nurse-Midwife   11/04/2020  2:01 PM

## 2020-11-06 ENCOUNTER — Other Ambulatory Visit: Payer: Self-pay

## 2020-11-06 ENCOUNTER — Inpatient Hospital Stay (HOSPITAL_COMMUNITY)
Admission: AD | Admit: 2020-11-06 | Discharge: 2020-11-06 | Disposition: A | Discharge status: Home / Self Care | Payer: BC Managed Care – PPO | Attending: Obstetrics and Gynecology | Admitting: Obstetrics and Gynecology

## 2020-11-06 ENCOUNTER — Encounter (HOSPITAL_COMMUNITY): Payer: Self-pay | Admitting: Obstetrics and Gynecology

## 2020-11-06 ENCOUNTER — Inpatient Hospital Stay (HOSPITAL_BASED_OUTPATIENT_CLINIC_OR_DEPARTMENT_OTHER): Payer: BC Managed Care – PPO

## 2020-11-06 DIAGNOSIS — Z79899 Other long term (current) drug therapy: Secondary | ICD-10-CM | POA: Diagnosis not present

## 2020-11-06 DIAGNOSIS — I82462 Acute embolism and thrombosis of left calf muscular vein: Secondary | ICD-10-CM | POA: Insufficient documentation

## 2020-11-06 DIAGNOSIS — M7989 Other specified soft tissue disorders: Secondary | ICD-10-CM | POA: Diagnosis not present

## 2020-11-06 DIAGNOSIS — O99893 Other specified diseases and conditions complicating puerperium: Secondary | ICD-10-CM | POA: Insufficient documentation

## 2020-11-06 DIAGNOSIS — Z791 Long term (current) use of non-steroidal anti-inflammatories (NSAID): Secondary | ICD-10-CM | POA: Insufficient documentation

## 2020-11-06 DIAGNOSIS — O871 Deep phlebothrombosis in the puerperium: Secondary | ICD-10-CM | POA: Diagnosis not present

## 2020-11-06 DIAGNOSIS — M79662 Pain in left lower leg: Secondary | ICD-10-CM

## 2020-11-06 DIAGNOSIS — M79605 Pain in left leg: Secondary | ICD-10-CM | POA: Insufficient documentation

## 2020-11-06 DIAGNOSIS — I82409 Acute embolism and thrombosis of unspecified deep veins of unspecified lower extremity: Secondary | ICD-10-CM | POA: Diagnosis not present

## 2020-11-06 MED ORDER — ENOXAPARIN SODIUM 80 MG/0.8ML IJ SOSY: 1.0000 mg/kg | PREFILLED_SYRINGE | Freq: Two times a day (BID) | INTRAMUSCULAR | 28 refills | Status: DC

## 2020-11-06 MED ORDER — ENOXAPARIN SODIUM 80 MG/0.8ML IJ SOSY
70.0000 mg | PREFILLED_SYRINGE | Freq: Two times a day (BID) | INTRAMUSCULAR | Status: DC
  Administered 2020-11-06: 70 mg via SUBCUTANEOUS
  Filled 2020-11-06 (×2): qty 0.7

## 2020-11-06 NOTE — Progress Notes (Signed)
Pharmacy Antibiotic Note  Lauren Ashley is a 35 y.o. female admitted on 11/06/2020 with left DVT s/p LTCS on 5/8.  Pharmacy has been consulted for Lovenox dosing.  Plan: Lovenox 70mg  sq bid (1mg /kg)  Weight: 71.7 kg (158 lb 1.6 oz)  Temp (24hrs), Avg:98.4 F (36.9 C), Min:98.4 F (36.9 C), Max:98.4 F (36.9 C)  Recent Labs  Lab 10/31/20 0436 11/02/20 0441  WBC 10.0 14.6*    CrCl cannot be calculated (No successful lab value found.).    Allergies  Allergen Reactions  . Erythromycin Nausea And Vomiting  . Penicillins Nausea And Vomiting     Thank you for allowing pharmacy to be a part of this patient's care.  12/31/20 11/06/2020 7:33 PM

## 2020-11-06 NOTE — MAU Note (Signed)
Pt delivered c-section on 11/01/20. Pt c/o lower  leg swelling since delivery and pain on left lower leg. Does not have any redness.

## 2020-11-06 NOTE — Progress Notes (Signed)
Lower extremity venous LT study completed.  Preliminary results relayed to Rasch, NP.   See CV Proc for preliminary results report.   Jean Rosenthal, RDMS, RVT

## 2020-11-06 NOTE — Discharge Instructions (Signed)
Deep Vein Thrombosis  Deep vein thrombosis (DVT) is a condition in which a blood clot forms in a deep vein, such as a vein in the lower leg, thigh, pelvis, or arm. Deep veins are veins in the deep venous system. A clot is blood that has thickened into a gel or solid. This condition is serious and can be life-threatening if the clot travels to the lungs and causes a blockage (pulmonary embolism) in the arteries of the lung. A DVT can also damage veins in the leg. This can lead to long-term, or chronic, venous disease, leg pain, swelling, discoloration, and ulcers or sores (post-thrombotic syndrome). What are the causes? This condition may be caused by:  A slowdown of blood flow.  Damage to a vein.  A condition that causes blood to clot more easily, such as certain blood-clotting disorders. What increases the risk? The following factors may make you more likely to develop this condition:  Having obesity.  Being older, especially older than age 60.  Being inactive (sedentary lifestyle) or not moving around. This may include: ? Sitting or lying down for longer than 4-6 hours other than to sleep at night. ? Being in the hospital, having major or lengthy surgery, or having a thin, flexible tube (central line catheter) placed in a large vein.  Being pregnant, giving birth, or having recently given birth.  Taking medicines that contain estrogen, such as birth control or hormone replacement therapy.  Using products that contain nicotine or tobacco, especially if you use hormonal birth control.  Having a history of blood clots or a blood-clotting disease, a blood vessel disease (peripheral vascular disease), or congestive heart disease.  Having a history of cancer, especially if being treated with chemotherapy. What are the signs or symptoms? Symptoms of this condition include:  Swelling, pain, pressure, or tenderness in an arm or a leg.  An arm or a leg becoming warm, red, or  discolored.  A leg turning very pale. You may have a large DVT. This is rare. If the clot is in your leg, you may notice symptoms more or have worse symptoms when you stand or walk. In some cases, there are no symptoms. How is this diagnosed? This condition is diagnosed with:  Your medical history and a physical exam.  Tests, such as: ? Blood tests to check how well your blood clots. ? Doppler ultrasound. This is the best way to find a DVT. ? Venogram. Contrast dye is injected into a vein, and X-rays are taken to check for clots. How is this treated? Treatment for this condition depends on:  The cause of your DVT.  The size and location of your DVT, or having more than one DVT.  Your risk for bleeding or developing more clots.  Other medical conditions you may have. Treatment may include:  Taking a blood thinner, also called an anticoagulant, to prevent clots from forming and growing.  Wearing compression stockings, if directed.  Injecting medicines into the affected vein to break up the clot (catheter-directed thrombolysis). This is used only for severe DVT and only if a specialist recommends it.  Specific surgical procedures, when DVT is severe or hard to treat. These may be done to: ? Isolate and remove your clot. ? Place an inferior vena cava (IVC) filter in a large vein to catch blood clots before they reach your lungs. You may get some medical treatments for 6 months or longer. Follow these instructions at home: If you are taking blood thinners:    Talk with your health care provider before you take any medicines that contain aspirin or NSAIDs, such as ibuprofen. These medicines increase your risk for dangerous bleeding.  Take your medicine exactly as told, at the same time every day. Do not skip a dose. Do not take more than the prescribed dose. This is important.  Ask your health care provider about foods and medicines that could change the way your blood thinner  works (may interact). Avoid these foods and medicines if you are told to do so.  Avoid anything that may cause bleeding or bruising. You may bleed more easily while taking blood thinners. ? Be very careful when using knives, scissors, or other sharp objects. ? Use an electric razor instead of a blade. ? Avoid activities that could cause injury or bruising, and follow instructions for preventing falls. ? Tell your health care provider if you have had any internal bleeding, bleeding ulcers, or neurologic diseases, such as strokes or cerebral aneurysms.  Wear a medical alert bracelet or carry a card that lists what medicines you take. General instructions  Take over-the-counter and prescription medicines only as told by your health care provider.  Return to your normal activities as told by your health care provider. Ask your health care provider what activities are safe for you.  If recommended, wear compression stockings as told by your health care provider. These stockings help to prevent blood clots and reduce swelling in your legs.  Keep all follow-up visits as told by your health care provider. This is important. Contact a health care provider if:  You miss a dose of your blood thinner.  You have new or worse pain, swelling, or redness in an arm or a leg.  You have worsening numbness or tingling in an arm or a leg.  You have unusual bruising. Get help right away if:  You have signs or symptoms that a blood clot has moved to the lungs. These may include: ? Shortness of breath. ? Chest pain. ? Fast or irregular heartbeats (palpitations). ? Light-headedness or dizziness. ? Coughing up blood.  You have signs or symptoms that your blood is too thin. These may include: ? Blood in your vomit, stool, or urine. ? A cut that will not stop bleeding. ? A menstrual period that is heavier than usual. ? A severe headache or confusion. These symptoms may represent a serious problem that  is an emergency. Do not wait to see if the symptoms will go away. Get medical help right away. Call your local emergency services (911 in the [place] Do not drive yourself to the hospital. Summary  Deep vein thrombosis (DVT) happens when a blood clot forms in a deep vein. This may occur in the lower leg, thigh, pelvis, or arm.  Symptoms affect the arm or leg and can include swelling, pain, tenderness, warmth, redness, or discoloration.  This condition may be treated with medicines or compression stockings. In severe cases, surgery may be done.  If you are taking blood thinners, take them exactly as told. Do not skip a dose. Do not take more than is prescribed.  Get help right away if you have shortness of breath, chest pain, fast or irregular heartbeats, or blood in your vomit, urine, or stool. This information is not intended to replace advice given to you by your health care provider. Make sure you discuss any questions you have with your health care provider. Document Revised: 06/08/2019 Document Reviewed: 06/08/2019 Elsevier Patient Education  2021 [employer]   [employer].  

## 2020-11-06 NOTE — MAU Provider Note (Signed)
History     CSN: [id number]  Arrival date and time: 11/06/20 1727   None     Chief Complaint  Patient presents with  . Leg Pain  . Leg Swelling   HPI   [name] is a 35 y.o. female [phone] status post LTCS on 11/01/20 here with left lower leg pain.  Pain started almost immedietely after having her baby, while in the hospital. The pain worsens when she is up walking around or putting pressure on her legs. She has no shortness of breath or chest pain.  She reports swelling in her legs bilaterally. She was placed on HCTZ and does not feel like this is helping her edema.   OB History    Gravida  3   Para  3   Term  2   Preterm  1   AB      Living  3     SAB      IAB      Ectopic      Multiple  0   Live Births  3           Past Medical History:  Diagnosis Date  . Medical history non-contributory     Past Surgical History:  Procedure Laterality Date  . CESAREAN SECTION N/A 11/01/2020   Procedure: CESAREAN SECTION;  Surgeon: Noland Fordyce, MD;  Location: [facility];  Service: Obstetrics;  Laterality: N/A;  . EYE SURGERY    . TONSILLECTOMY    . WISDOM TOOTH EXTRACTION      Family History  Problem Relation Age of Onset  . Cancer Father     Social History   Tobacco Use  . Smoking status: Never Smoker  . Smokeless tobacco: Never Used  Vaping Use  . Vaping Use: Never used  Substance Use Topics  . Alcohol use: Not Currently  . Drug use: Never    Allergies:  Allergies  Allergen Reactions  . Erythromycin Nausea And Vomiting  . Penicillins Nausea And Vomiting    Medications Prior to Admission  Medication Sig Dispense Refill Last Dose  . hydrochlorothiazide (HYDRODIURIL) 25 MG tablet Take 1 tablet (25 mg total) by mouth daily for 5 days. 5 tablet 0 11/06/2020 at Unknown time  . ibuprofen (ADVIL) 600 MG tablet Take 1 tablet (600 mg total) by mouth every 6 (six) hours. 30 tablet 0 11/06/2020 at Unknown time  . iron polysaccharides  (FERREX 150) 150 MG capsule Take 1 capsule (150 mg total) by mouth daily.   11/06/2020 at Unknown time  . magnesium oxide (MAG-OX) 400 (240 Mg) MG tablet Take 1 tablet (400 mg total) by mouth daily. For prevention of constipation. 30 tablet  11/06/2020 at Unknown time  . oxyCODONE (OXY IR/ROXICODONE) 5 MG immediate release tablet Take 1-2 tablets (5-10 mg total) by mouth every 4 (four) hours as needed for moderate pain. 30 tablet 0 11/06/2020 at Unknown time  . Prenatal MV & Min w/FA-DHA (PRENATAL ADULT GUMMY/DHA/FA PO) Take 2 each by mouth daily.   11/05/2020 at Unknown time  . acetaminophen (TYLENOL) 500 MG tablet Take 2 tablets (1,000 mg total) by mouth every 6 (six) hours. 30 tablet 0   . coconut oil OIL Apply 1 application topically as needed.  0   . famotidine (PEPCID) 20 MG tablet Take 20 mg by mouth 2 (two) times daily.     . hydrochlorothiazide (MICROZIDE) 12.5 MG capsule Take 1 capsule (12.5 mg total) by mouth daily for 4 days. 4  capsule 0    VAS US LOWER EXTREMITY VENOUS (DVT)  Result Date: 11/06/2020  Lower Venous DVT Study Patient Name:  Lauren Ashley  Date of Exam:   11/06/2020 Medical Rec #: [id number]              Accession #:    [id number] Date of Birth: 07/25/1985               Patient Gender: F Patient Age:   [age]Y [facility] Procedure:      VAS US LOWER EXTREMITY VENOUS (DVT) Referring Phys: [id number] Victorino DikeJENNIFER I Euriah Matlack --------------------------------------------------------------------------------  Indications: Acute pain LT calf.  Comparison Study: No prior studies. Performing Technologist: Jean Rosenthalachel Hodge RDMS,RVT  Examination Guidelines: A complete evaluation includes B-mode imaging, spectral Doppler, color Doppler, and power Doppler as needed of all accessible portions of each vessel. Bilateral testing is considered an integral part of a complete examination. Limited examinations for reoccurring indications may be performed as noted. The reflux portion of the exam  is performed with the patient in reverse Trendelenburg.  +-----+---------------+---------+-----------+----------+--------------+ RIGHTCompressibilityPhasicitySpontaneityPropertiesThrombus Aging +-----+---------------+---------+-----------+----------+--------------+ CFV  Full           Yes      Yes                                 +-----+---------------+---------+-----------+----------+--------------+   +---------+---------------+---------+-----------+----------+--------------+ LEFT     CompressibilityPhasicitySpontaneityPropertiesThrombus Aging +---------+---------------+---------+-----------+----------+--------------+ CFV      Full           Yes      Yes                                 +---------+---------------+---------+-----------+----------+--------------+ SFJ      Full                                                        +---------+---------------+---------+-----------+----------+--------------+ FV Prox  Full                                                        +---------+---------------+---------+-----------+----------+--------------+ FV Mid   Full                                                        +---------+---------------+---------+-----------+----------+--------------+ FV DistalFull                                                        +---------+---------------+---------+-----------+----------+--------------+ PFV      Full                                                        +---------+---------------+---------+-----------+----------+--------------+  POP      Full           Yes      Yes                                 +---------+---------------+---------+-----------+----------+--------------+ PTV      Full                                                        +---------+---------------+---------+-----------+----------+--------------+ PERO     Full                                                         +---------+---------------+---------+-----------+----------+--------------+ Soleal   None           No       No                   Acute          +---------+---------------+---------+-----------+----------+--------------+ Gastroc  Full                                                        +---------+---------------+---------+-----------+----------+--------------+    Summary: RIGHT: - No evidence of common femoral vein obstruction.  LEFT: - Findings consistent with acute deep vein thrombosis involving the left soleal veins. - No cystic structure found in the popliteal fossa.  *See table(s) above for measurements and observations.    Preliminary    Review of Systems  Respiratory: Negative for chest tightness and shortness of breath.    Physical Exam   Blood pressure 118/72, pulse 90, temperature 98.4 F (36.9 C), resp. rate 18, SpO2 100 %, unknown if currently breastfeeding.  Physical Exam Vitals and nursing note reviewed.  Constitutional:      General: She is not in acute distress.    Appearance: Normal appearance. She is not ill-appearing, toxic-appearing or diaphoretic.  Eyes:     Pupils: Pupils are equal, round, and reactive to light.  Pulmonary:     Effort: Pulmonary effort is normal. No respiratory distress.  Musculoskeletal:     Cervical back: Neck supple.     Right lower leg: Edema present.     Left lower leg: Edema present.     Comments: Right calf 36 cm Left calf 36 cm No warmth or erythema.   Skin:    General: Skin is warm.  Neurological:     Mental Status: She is alert and oriented to person, place, and time.  Psychiatric:        Behavior: Behavior normal.    MAU Course  Procedures None  MDM  DVT US done and confirmed left DVT. Patient without chest pain or SOB. She has no tachycardia.  Reviewed patient with Dr. Charlotta Newton and Dr. Dollene Primrose Lovenox dosed per pharmacy, the patient was instructed by the RN how to inject.   Assessment and Plan   A:  1.  Deep vein thrombosis (DVT),  postpartum   2. Left leg pain     P:  Discharge home Rx: Lovenox 1mg /kg Strict return precautions F/u with [facility] next week, call the office on Monday.  PE precautions.    Wednesday I, NP 11/06/2020 8:00 PM

## 2020-11-10 ENCOUNTER — Telehealth: Payer: Self-pay | Admitting: Hematology

## 2020-11-10 ENCOUNTER — Inpatient Hospital Stay: Payer: BC Managed Care – PPO

## 2020-11-10 ENCOUNTER — Other Ambulatory Visit: Payer: Self-pay

## 2020-11-10 ENCOUNTER — Inpatient Hospital Stay: Payer: BC Managed Care – PPO | Attending: Hematology | Admitting: Hematology

## 2020-11-10 VITALS — BP 97/58 | HR 87 | Temp 97.8°F | Resp 18 | Wt 147.0 lb

## 2020-11-10 DIAGNOSIS — D62 Acute posthemorrhagic anemia: Secondary | ICD-10-CM

## 2020-11-10 DIAGNOSIS — I824Z2 Acute embolism and thrombosis of unspecified deep veins of left distal lower extremity: Secondary | ICD-10-CM

## 2020-11-10 DIAGNOSIS — I82462 Acute embolism and thrombosis of left calf muscular vein: Secondary | ICD-10-CM | POA: Diagnosis not present

## 2020-11-10 DIAGNOSIS — D508 Other iron deficiency anemias: Secondary | ICD-10-CM | POA: Diagnosis not present

## 2020-11-10 DIAGNOSIS — Z9889 Other specified postprocedural states: Secondary | ICD-10-CM | POA: Diagnosis not present

## 2020-11-10 DIAGNOSIS — D509 Iron deficiency anemia, unspecified: Secondary | ICD-10-CM | POA: Diagnosis not present

## 2020-11-10 DIAGNOSIS — Z7901 Long term (current) use of anticoagulants: Secondary | ICD-10-CM

## 2020-11-10 LAB — CMP (CANCER CENTER ONLY)
ALT: 23 U/L (ref 0–44)
AST: 21 U/L (ref 15–41)
Albumin: 3.7 g/dL (ref 3.5–5.0)
Alkaline Phosphatase: 110 U/L (ref 38–126)
Anion gap: 10 (ref 5–15)
BUN: 14 mg/dL (ref 6–20)
CO2: 28 mmol/L (ref 22–32)
Calcium: 9.1 mg/dL (ref 8.9–10.3)
Chloride: 102 mmol/L (ref 98–111)
Creatinine: 0.74 mg/dL (ref 0.44–1.00)
GFR, Estimated: >60 mL/min (ref >60)
Glucose, Bld: 92 mg/dL (ref 70–99)
Potassium: 4.5 mmol/L (ref 3.5–5.1)
Sodium: 140 mmol/L (ref 135–145)
Total Bilirubin: 0.4 mg/dL (ref 0.3–1.2)
Total Protein: 7.4 g/dL (ref 6.5–8.1)

## 2020-11-10 LAB — CBC WITH DIFFERENTIAL/PLATELET
Abs Immature Granulocytes: 0.33 10*3/uL — ABNORMAL HIGH (ref 0.00–0.07)
Basophils Absolute: 0.1 10*3/uL (ref 0.0–0.1)
Basophils Relative: 1 %
Eosinophils Absolute: 0.1 10*3/uL (ref 0.0–0.5)
Eosinophils Relative: 2 %
HCT: 39.3 % (ref 36.0–46.0)
Hemoglobin: 13.1 g/dL (ref 12.0–15.0)
Immature Granulocytes: 3 %
Lymphocytes Relative: 25 %
Lymphs Abs: 2.4 10*3/uL (ref 0.7–4.0)
MCH: 31.3 pg (ref 26.0–34.0)
MCHC: 33.3 g/dL (ref 30.0–36.0)
MCV: 94 fL (ref 80.0–100.0)
Monocytes Absolute: 0.9 10*3/uL (ref 0.1–1.0)
Monocytes Relative: 9 %
Neutro Abs: 5.8 10*3/uL (ref 1.7–7.7)
Neutrophils Relative %: 60 %
Platelets: 376 10*3/uL (ref 150–400)
RBC: 4.18 MIL/uL (ref 3.87–5.11)
RDW: 14.6 % (ref 11.5–15.5)
WBC: 9.6 10*3/uL (ref 4.0–10.5)
nRBC: 0 % (ref 0.0–0.2)

## 2020-11-10 LAB — IRON AND TIBC
Iron: 100 ug/dL (ref 41–142)
Saturation Ratios: 19 % — ABNORMAL LOW (ref 21–57)
TIBC: 516 ug/dL — ABNORMAL HIGH (ref 236–444)
UIBC: 417 ug/dL — ABNORMAL HIGH (ref 120–384)

## 2020-11-10 LAB — FERRITIN: Ferritin: 273 ng/mL (ref 11–307)

## 2020-11-10 LAB — SAMPLE TO BLOOD BANK

## 2020-11-10 LAB — VITAMIN B12: Vitamin B-12: 292 pg/mL (ref 180–914)

## 2020-11-10 MED ORDER — ENOXAPARIN SODIUM 80 MG/0.8ML IJ SOSY: 65.0000 mg | PREFILLED_SYRINGE | Freq: Two times a day (BID) | INTRAMUSCULAR | 0 refills | Status: DC

## 2020-11-10 NOTE — Telephone Encounter (Signed)
Received an urgent new hem referral from Dr. Billy Coast for dvt. Lauren Ashley returned my call and has been scheduled to see Dr. Candise Che today at 11am. Pt aware to arrive 30 minutes early.

## 2020-11-10 NOTE — Progress Notes (Signed)
HEMATOLOGY/ONCOLOGY CONSULTATION NOTE  Date of Service: 11/10/2020  Patient Care Team: Patient, No Pcp Per (Inactive) as PCP - General (General Practice)  CHIEF COMPLAINTS/PURPOSE OF CONSULTATION:  DVT  HISTORY OF PRESENTING ILLNESS:   Lauren Ashley is a wonderful 35 y.o. female who has been referred to us by Dr. Billy Coastaavon for evaluation and management of DVT. The pt reports that she is doing well overall.  The pt reports that she has been very healthy overall. This recent birth was her first c-section. She denies any prior clotting events and any familial history of clots or clotting disorders. She has not been on any chronic medications outside of regular vitamins. The pt notes no smoking or tobacco usage. She works at [employer] as a Psychologist, educationalwriting and psychology tutor.  The pt notes that she went under preterm labor pregnancy. She had arrest of descent after one hour and went into a c-section. The pt notes she had an adverse reaction to the epidural and was in extreme back pain.. The pt notes this pain was worse than contractions. The pt notes no issues during the pregnancy. Her oldest child is currently 24five years old, second child is currently 5220 months old, and just had her third child on 05/08. The pt notes she was not on iron supplementation during pregnancy, only prenatal vitamins. The pt was given 1 unit IV Venofer after birth and was discharged on 150 mg Ferrex daily. The pt is here today for both her iron deficiency anemia and the DVT she had due to the cesarean section. The pt notes edema b/l prior to being discharged. She told the nurses and midwife, but they were not concerned at the time. The pt notes she was not able to flex her calf much at all and this pain was worsening. The pt's doctor instructed her to go back to the hospital. She has since started on Lovenox. The pt is currently still experiencing postpartum bleeding that fills one pad daily with  no clots. This was improving prior to starting the Lovenox and has been steady at this rate since. The pt notes that she was instructed she would only be on the Lovenox shots for ten days. The pt notes her mother is currently giving her the injections. The pt notes the toes on her left foot are tingling and still feel numb. She notes this is persistent and happened since she has been walking more and the swelling decreased in the last two or so days.  The pt has had VAS US Lower Extremities on 11/06/2020, which revealed: RIGHT: - No evidence of common femoral vein obstruction. LEFT: - Findings consistent with acute deep vein thrombosis involving the left soleal veins. - No cystic structure found in the popliteal fossa."  Lab results 11/02/2020 of CBC is as follows: all values are WNL except for WBC of 14.6K, RBC of 2.49, Hgb of 7.8, Hct of 23.4, Plt of 138K.  On review of systems, pt reports ankle swelling, recent DVT in left ankle, recent pregnancy, postpartum bleeding and denies abdominal pain, and any other symptoms.   MEDICAL HISTORY:  Past Medical History:  Diagnosis Date  . Medical history non-contributory     SURGICAL HISTORY: Past Surgical History:  Procedure Laterality Date  . CESAREAN SECTION N/A 11/01/2020   Procedure: CESAREAN SECTION;  Surgeon: Noland FordyceFogleman, Kelly, MD;  Location: [facility] LD ORS;  Service: Obstetrics;  Laterality: N/A;  . EYE SURGERY    . TONSILLECTOMY    .  WISDOM TOOTH EXTRACTION      SOCIAL HISTORY: Social History   Socioeconomic History  . Marital status: Married    Spouse name: Not on file  . Number of children: Not on file  . Years of education: Not on file  . Highest education level: Not on file  Occupational History  . Not on file  Tobacco Use  . Smoking status: Never Smoker  . Smokeless tobacco: Never Used  Vaping Use  . Vaping Use: Never used  Substance and Sexual Activity  . Alcohol use: Not Currently  . Drug use: Never  . Sexual activity:  Not on file  Other Topics Concern  . Not on file  Social History Narrative  . Not on file   Social Determinants of Health   Financial Resource Strain: Not on file  Food Insecurity: Not on file  Transportation Needs: Not on file  Physical Activity: Not on file  Stress: Not on file  Social Connections: Not on file  Intimate Partner Violence: Not on file    FAMILY HISTORY: Family History  Problem Relation Age of Onset  . Cancer Father     ALLERGIES:  is allergic to erythromycin and penicillins.  MEDICATIONS:  Current Outpatient Medications  Medication Sig Dispense Refill  . acetaminophen (TYLENOL) 500 MG tablet Take 2 tablets (1,000 mg total) by mouth every 6 (six) hours. 30 tablet 0  . coconut oil OIL Apply 1 application topically as needed.  0  . enoxaparin (LOVENOX) 80 MG/0.8ML injection Inject 0.725 mLs (72.5 mg total) into the skin 2 (two) times daily for 28 doses. 0.8 mL 28  . famotidine (PEPCID) 20 MG tablet Take 20 mg by mouth 2 (two) times daily.    . hydrochlorothiazide (HYDRODIURIL) 25 MG tablet Take 1 tablet (25 mg total) by mouth daily for 5 days. 5 tablet 0  . ibuprofen (ADVIL) 600 MG tablet Take 1 tablet (600 mg total) by mouth every 6 (six) hours. 30 tablet 0  . iron polysaccharides (FERREX 150) 150 MG capsule Take 1 capsule (150 mg total) by mouth daily.    . magnesium oxide (MAG-OX) 400 (240 Mg) MG tablet Take 1 tablet (400 mg total) by mouth daily. For prevention of constipation. 30 tablet   . oxyCODONE (OXY IR/ROXICODONE) 5 MG immediate release tablet Take 1-2 tablets (5-10 mg total) by mouth every 4 (four) hours as needed for moderate pain. 30 tablet 0  . Prenatal MV & Min w/FA-DHA (PRENATAL ADULT GUMMY/DHA/FA PO) Take 2 each by mouth daily.     No current facility-administered medications for this visit.    REVIEW OF SYSTEMS:   10 Point review of Systems was done is negative except as noted above.  PHYSICAL EXAMINATION: ECOG PERFORMANCE STATUS: 1 -  Symptomatic but completely ambulatory  . Vitals:   11/10/20 1126  BP: (!) 97/58  Pulse: 87  Resp: 18  Temp: 97.8 F (36.6 C)  SpO2: 100%   Filed Weights   11/10/20 1126  Weight: 147 lb (66.7 kg)   .Body mass index is 26.89 kg/m.  Exam was given in a chair.   GENERAL:alert, in no acute distress and comfortable SKIN: no acute rashes, no significant lesions EYES: conjunctiva are pink and non-injected, sclera anicteric OROPHARYNX: MMM, no exudates, no oropharyngeal erythema or ulceration NECK: supple, no JVD LYMPH:  no palpable lymphadenopathy in the cervical, axillary or inguinal regions LUNGS: clear to auscultation b/l with normal respiratory effort HEART: regular rate & rhythm ABDOMEN:  normoactive bowel  sounds , non tender, not distended. Extremity: no pedal edema PSYCH: alert & oriented x 3 with fluent speech NEURO: no focal motor/sensory deficits  LABORATORY DATA:  I have reviewed the data as listed  . CBC Latest Ref Rng & Units 11/02/2020 10/31/2020 03/01/2019  WBC 4.0 - 10.5 K/uL 14.6(H) 10.0 9.1  Hemoglobin 12.0 - 15.0 g/dL 7.8(L) 11.6(L) 10.7(L)  Hematocrit 36.0 - 46.0 % 23.4(L) 34.5(L) 30.7(L)  Platelets 150 - 400 K/uL 138(L) 166 144(L)    .No flowsheet data found.   RADIOGRAPHIC STUDIES: I have personally reviewed the radiological images as listed and agreed with the findings in the report. VAS [place] LOWER EXTREMITY VENOUS (DVT)  Result Date: 11/07/2020  Lower Venous DVT Study Patient Name:  Lauren Ashley  Date of Exam:   11/06/2020 Medical Rec #: [id number]              Accession #:    [id number] Date of Birth: October 13, 1985               Patient Gender: F Patient Age:   65Y Exam Location:  [facility] Procedure:      VAS [place] LOWER EXTREMITY VENOUS (DVT) Referring Phys: [id number] Victorino Dike I RASCH --------------------------------------------------------------------------------  Indications: Acute pain LT calf.  Comparison Study: No prior studies. Performing  Technologist: Jean Rosenthal RDMS,RVT  Examination Guidelines: A complete evaluation includes B-mode imaging, spectral Doppler, color Doppler, and power Doppler as needed of all accessible portions of each vessel. Bilateral testing is considered an integral part of a complete examination. Limited examinations for reoccurring indications may be performed as noted. The reflux portion of the exam is performed with the patient in reverse Trendelenburg.  +-----+---------------+---------+-----------+----------+--------------+ RIGHTCompressibilityPhasicitySpontaneityPropertiesThrombus Aging +-----+---------------+---------+-----------+----------+--------------+ CFV  Full           Yes      Yes                                 +-----+---------------+---------+-----------+----------+--------------+   +---------+---------------+---------+-----------+----------+--------------+ LEFT     CompressibilityPhasicitySpontaneityPropertiesThrombus Aging +---------+---------------+---------+-----------+----------+--------------+ CFV      Full           Yes      Yes                                 +---------+---------------+---------+-----------+----------+--------------+ SFJ      Full                                                        +---------+---------------+---------+-----------+----------+--------------+ FV Prox  Full                                                        +---------+---------------+---------+-----------+----------+--------------+ FV Mid   Full                                                        +---------+---------------+---------+-----------+----------+--------------+  FV DistalFull                                                        +---------+---------------+---------+-----------+----------+--------------+ PFV      Full                                                         +---------+---------------+---------+-----------+----------+--------------+ POP      Full           Yes      Yes                                 +---------+---------------+---------+-----------+----------+--------------+ PTV      Full                                                        +---------+---------------+---------+-----------+----------+--------------+ PERO     Full                                                        +---------+---------------+---------+-----------+----------+--------------+ Soleal   None           No       No                   Acute          +---------+---------------+---------+-----------+----------+--------------+ Gastroc  Full                                                        +---------+---------------+---------+-----------+----------+--------------+     Summary: RIGHT: - No evidence of common femoral vein obstruction.  LEFT: - Findings consistent with acute deep vein thrombosis involving the left soleal veins. - No cystic structure found in the popliteal fossa.  *See table(s) above for measurements and observations. Electronically signed by Lemar Livings MD on 11/07/2020 at 11:06:13 AM.    Final     ASSESSMENT & PLAN:   35 yo with   1) Acute left soleal DVT triggered by C -section, pregnancy/post partum state. Patient on lovenox. Currently breast feeding  2) Iron deficiency Anemia -- due to pregnancy and blood loss due to C-Section surgery and some postpartum uterine bleeding.  PLAN: -Advised pt that her IDA is due to blood loss from pregnancy and should not need transfusion support. We can offer her the option of IV iron if desired.  -Advised pt that clots below the knee are lower risk to travel to the lungs and typically require less time on blood thinners. -Discussed period of increased risk of clots around delivery and up to six weeks after.  -Advised  pt that her clot appears to be provoked due to c-section and was not very  extensive to explain an outside factor than the surgery and pregnancy.  -Recommended pt f/u w OBGYN regarding if there is any other local risk factors for increased postpartum bleeding. -Advised pt we often have to switch to preventive doses of blood thinners while experiencing significant postpartum bleeding. -Advised pt that we typically continue blood thinners for three months for a provoked calf clot. Would not recommend Eliquis or Xarelto while breastfeeding due to getting in the milk.  -Discussed dosing switching to once daily if no significant vaginal bleeding in the next 2 weeks. -Would not recommend getting full hypercoagulability workup done since this is a clearly provoked event and low-risk clot. -Advised pt that under-treating clot would increase risk of clot progression and potentially affect her clotting risks long-term. -Advised pt that due to ongoing blood loss, we would desire to aggressively replace the iron levels to avoid transfusion support demands. -Recommended pt take Baby ASA for 3-4 months after completion of 3 month course of Lovenox. -Will get labs today. -Will get IV Injectafer x 2 doses in 1 week. -Rx Lovenox. The pt uses [facility] at [address] in [place], [place]. -Will see back in 2 weeks via phone.    FOLLOW UP: Labs Today IV Injectafer weekly x 2 doses ASAP Phone Visit in 2 weeks w Dr Candise Che   All of the patients questions were answered with apparent satisfaction. The patient knows to call the clinic with any problems, questions or concerns.  I spent 40 minutes counseling the patient face to face. The total time spent in the appointment was 60 minutes and more than 50% was on counseling and direct patient cares.    Wyvonnia Lora MD MS AAHIVMS [facility] Hematology/Oncology Physician [facility]  (Office):       [phone] (Work cell):  [phone] (Fax):           [phone]  11/10/2020 11:58 AM  I, Minda Meo, am acting as  scribe for Dr. Wyvonnia Lora, MD.   .I have reviewed the above documentation for accuracy and completeness, and I agree with the above. Johney Maine MD    ADDENDUM  . CBC Latest Ref Rng & Units 11/10/2020 11/02/2020 10/31/2020  WBC 4.0 - 10.5 K/uL 9.6 14.6(H) 10.0  Hemoglobin 12.0 - 15.0 g/dL 54.5 6.2(B) 11.6(L)  Hematocrit 36.0 - 46.0 % 39.3 23.4(L) 34.5(L)  Platelets 150 - 400 K/uL 376 138(L) 166    . CMP Latest Ref Rng & Units 11/10/2020  Glucose 70 - 99 mg/dL 92  BUN 6 - 20 mg/dL 14  Creatinine 6.38 - 9.37 mg/dL 3.42  Sodium 876 - 811 mmol/L 140  Potassium 3.5 - 5.1 mmol/L 4.5  Chloride 98 - 111 mmol/L 102  CO2 22 - 32 mmol/L 28  Calcium 8.9 - 10.3 mg/dL 9.1  Total Protein 6.5 - 8.1 g/dL 7.4  Total Bilirubin 0.3 - 1.2 mg/dL 0.4  Alkaline Phos 38 - 126 U/L 110  AST 15 - 41 U/L 21  ALT 0 - 44 U/L 23    . Lab Results  Component Value Date   IRON 100 11/10/2020   TIBC 516 (H) 11/10/2020   IRONPCTSAT 19 (L) 11/10/2020   (Iron and TIBC)  Lab Results  Component Value Date   FERRITIN 273 11/10/2020   Patient hgb has normalized to 13.1 and ferritin @ 273 is adequate after 1 dose of  IV iron in the hospital. Plan -based on patients preference and needs-- will hold off on additional IV Iron at this time. -iron rich food and po iron as recommended -continue Lovenox for 3 months -recommend po B complex once daily  .Johney Maine MD

## 2020-11-12 ENCOUNTER — Telehealth: Payer: Self-pay | Admitting: *Deleted

## 2020-11-12 NOTE — Telephone Encounter (Signed)
Pt called requesting lab results.  Per Dr Candise Che Hgb has significantly increased.  Ok for pt to continue oral iron supplement without iron infusions at this time.  Pt will need to continue lovenox injections vs oral anticoagulants while breast feeding.  Pt verbalized understanding.

## 2020-11-13 ENCOUNTER — Telehealth: Payer: Self-pay | Admitting: *Deleted

## 2020-11-13 ENCOUNTER — Telehealth: Payer: Self-pay | Admitting: Hematology

## 2020-11-13 NOTE — Telephone Encounter (Signed)
Patient wanted to know if she could combine her Lovenox injection into once a day  - stated she thought Dr. Candise Che said that in the appt. Patient states post partum bleeding has continued, but not increased - has decreased some. Dr. Candise Che informed> Per Dr. Candise Che - advise patient to see OB if post partum bleeding increases or for other changes related to post op or post partum occur. Request she contact this office in 1-2 weeks to report on amount of bleeding - if bleeding has continued to decrease, it may be possible to combine Lovenox dose and only take once per day. Patient informed and verbalized understanding.

## 2020-11-13 NOTE — Telephone Encounter (Signed)
Scheduled follow-up appointment per 5/20 secure chat. Patient is aware.

## 2020-11-18 ENCOUNTER — Ambulatory Visit: Payer: BC Managed Care – PPO

## 2020-11-20 NOTE — Addendum Note (Signed)
Addendum  created 11/20/20 1551 by Elmer Picker, MD   Clinical Note Signed

## 2020-11-20 NOTE — Anesthesia Post-op Follow-up Note (Signed)
Asked by Mikle Bosworth to reach out to patient to discuss ongoing symptoms including a headache and left leg paresthesias.  Pt received epidural analgesia on 10/31/20. Epidural placement was uncomplicated. Pt progressed to 10cm and pushed for 1+ hours. She was taken to the OR for a C section for failure to descend. The epidural was used for the C section without complication. Post operative course c/b LLE DVT discovered on 11/06/20 now on lovenox.   Regarding the HA, pt states that it started yesterday and is located between her eyes. She denies a headache during her L&D admission. She says it is constant in nature and is not affected by position. Denies nausea, vomiting, vision changes. I explained to the patient that this was not likely a PDPH given that it is not postural in nature, PDPH is uncommon this far removed from epidural placement, and there was no complications during epidural placement. I encouraged fluids, caffeine, tylenol, and motrin. I told the patient to return to [facility] if she developed any concerning signs of PDPH.   Regarding the left leg paresthesias, pt states that she feels tingling in her left buttocks and left toes. She states "they feel like they are asleep." She states that the tingling has not gotten any worse. She denies any weakness. She states that she is not having any trouble walking. She can walk up and down stairs, but she is slower than usual. She denies any color changes in that extremity. She states the swelling in that extremity has improved since her DVT diagnosis. Denies incontinence or saddle anesthesia. The epidural site is not red, swollen, or painful to touch. I explained to her that paresthesias can happen after delivery from many things such as pushing, C section, and epidural placement. Unfortunately, she was also diagnosed with a DVT in the same extremity as the paresthesias, but she is now on anticoagulation, and perfusion to that extremity is not impaired.  Currently, she does not have any concerning neurological symptoms. I explained to her that most paresthesias after delivery are not permanent; however, sometimes they can take weeks to months to resolve. We discussed concerning neurological symptoms, and I told the patient to return to [facility] if any of these symptoms developed. The patient voiced understanding and was amenable to the plan.

## 2020-11-26 ENCOUNTER — Telehealth: Payer: Self-pay

## 2020-11-26 NOTE — Telephone Encounter (Signed)
Returned call to pt regarding recent (since Sunday) increase in post partum bleeding. Pt is 3 weeks post partum s/p c-section. Explained to pt most likely not due to Lovenox, to monitor bleeding and if it does not start to decrease to let us know. Pt verbalized understanding.

## 2020-11-27 ENCOUNTER — Ambulatory Visit: Payer: BC Managed Care – PPO

## 2020-12-22 ENCOUNTER — Telehealth: Payer: Self-pay | Admitting: *Deleted

## 2020-12-22 NOTE — Telephone Encounter (Signed)
Patient called to ask about clearance for physical activity.  She was diagnosed with post partum DVT and is currently on Lovenox.    She states her OB has cleared her but wanted to know Dr Clyda Greener opinion on that.    She also reports increased cramps in her torso and extremities and wasn't sure if she should follow up with Dr Candise Che about that her OB.  Routed to MD to advise.

## 2020-12-24 ENCOUNTER — Other Ambulatory Visit: Payer: Self-pay | Admitting: Hematology

## 2021-01-14 NOTE — Progress Notes (Signed)
HEMATOLOGY/ONCOLOGY CONSULTATION NOTE  Date of Service: 01/14/2021  Patient Care Team: Patient, No Pcp Per (Inactive) as PCP - General (General Practice)  CHIEF COMPLAINTS/PURPOSE OF CONSULTATION:  DVT  HISTORY OF PRESENTING ILLNESS:   Lauren Ashley is a wonderful 35 y.o. female who has been referred to us by Dr. Billy Coastaavon for evaluation and management of DVT. The pt reports that she is doing well overall.  The pt reports that she has been very healthy overall. This recent birth was her first c-section. She denies any prior clotting events and any familial history of clots or clotting disorders. She has not been on any chronic medications outside of regular vitamins. The pt notes no smoking or tobacco usage. She works at [employer] as a Psychologist, educationalwriting and psychology tutor.  The pt notes that she went under preterm labor pregnancy. She had arrest of descent after one hour and went into a c-section. The pt notes she had an adverse reaction to the epidural and was in extreme back pain.. The pt notes this pain was worse than contractions. The pt notes no issues during the pregnancy. Her oldest child is currently 37five years old, second child is currently 5220 months old, and just had her third child on 05/08. The pt notes she was not on iron supplementation during pregnancy, only prenatal vitamins. The pt was given 1 unit IV Venofer after birth and was discharged on 150 mg Ferrex daily. The pt is here today for both her iron deficiency anemia and the DVT she had due to the cesarean section. The pt notes edema b/l prior to being discharged. She told the nurses and midwife, but they were not concerned at the time. The pt notes she was not able to flex her calf much at all and this pain was worsening. The pt's doctor instructed her to go back to the hospital. She has since started on Lovenox. The pt is currently still experiencing postpartum bleeding that fills one pad daily with  no clots. This was improving prior to starting the Lovenox and has been steady at this rate since. The pt notes that she was instructed she would only be on the Lovenox shots for ten days. The pt notes her mother is currently giving her the injections. The pt notes the toes on her left foot are tingling and still feel numb. She notes this is persistent and happened since she has been walking more and the swelling decreased in the last two or so days.  The pt has had VAS US Lower Extremities on 11/06/2020, which revealed: RIGHT: - No evidence of common femoral vein obstruction. LEFT: - Findings consistent with acute deep vein thrombosis involving the left soleal veins. - No cystic structure found in the popliteal fossa."  Lab results 11/02/2020 of CBC is as follows: all values are WNL except for WBC of 14.6K, RBC of 2.49, Hgb of 7.8, Hct of 23.4, Plt of 138K.  On review of systems, pt reports ankle swelling, recent DVT in left ankle, recent pregnancy, postpartum bleeding and denies abdominal pain, and any other symptoms.  INTERVAL HISTORY  Lauren Ashley is a wonderful 35 y.o. female who is here today for f/u of DVT. The patient's last visit with us was on 11/10/2020. The pt reports that she is doing well overall.  The pt reports her baby is doing well.  She has had no issues with taking her Lovenox as prescribed and describes no issues with unusual bleeding.  She has continued to take her oral iron.  Lab results today 01/15/2021 of CBC w/diff and CMP is as follows: Hemoglobin has continued to improve and is up to 13.8 up from a low of 7.8.  CBC is otherwise completely normal.  CMP within normal limits. Ferritin is 29.  B12 336  On review of systems, pt reports no other acute issues.  MEDICAL HISTORY:  Past Medical History:  Diagnosis Date   Medical history non-contributory     SURGICAL HISTORY: Past Surgical History:  Procedure Laterality Date   CESAREAN SECTION N/A 11/01/2020    Procedure: CESAREAN SECTION;  Surgeon: Noland Fordyce, MD;  Location: [facility] ORS;  Service: Obstetrics;  Laterality: N/A;   EYE SURGERY     TONSILLECTOMY     WISDOM TOOTH EXTRACTION      SOCIAL HISTORY: Social History   Socioeconomic History   Marital status: Married    Spouse name: Not on file   Number of children: Not on file   Years of education: Not on file   Highest education level: Not on file  Occupational History   Not on file  Tobacco Use   Smoking status: Never   Smokeless tobacco: Never  Vaping Use   Vaping Use: Never used  Substance and Sexual Activity   Alcohol use: Not Currently   Drug use: Never   Sexual activity: Not on file  Other Topics Concern   Not on file  Social History Narrative   Not on file   Social Determinants of Health   Financial Resource Strain: Not on file  Food Insecurity: Not on file  Transportation Needs: Not on file  Physical Activity: Not on file  Stress: Not on file  Social Connections: Not on file  Intimate Partner Violence: Not on file    FAMILY HISTORY: Family History  Problem Relation Age of Onset   Cancer Father     ALLERGIES:  is allergic to erythromycin and penicillins.  MEDICATIONS:  Current Outpatient Medications  Medication Sig Dispense Refill   acetaminophen (TYLENOL) 500 MG tablet Take 2 tablets (1,000 mg total) by mouth every 6 (six) hours. 30 tablet 0   coconut oil OIL Apply 1 application topically as needed.  0   enoxaparin (LOVENOX) 80 MG/0.8ML injection INJECT 0.65 MLS INTO THE SKIN TWICE DAILY FOR 60 DOSES 48 mL 2   famotidine (PEPCID) 20 MG tablet Take 20 mg by mouth 2 (two) times daily.     hydrochlorothiazide (HYDRODIURIL) 25 MG tablet Take 1 tablet (25 mg total) by mouth daily for 5 days. 5 tablet 0   ibuprofen (ADVIL) 600 MG tablet Take 1 tablet (600 mg total) by mouth every 6 (six) hours. 30 tablet 0   iron polysaccharides (FERREX 150) 150 MG capsule Take 1 capsule (150 mg total) by mouth daily.      magnesium oxide (MAG-OX) 400 (240 Mg) MG tablet Take 1 tablet (400 mg total) by mouth daily. For prevention of constipation. 30 tablet    oxyCODONE (OXY IR/ROXICODONE) 5 MG immediate release tablet Take 1-2 tablets (5-10 mg total) by mouth every 4 (four) hours as needed for moderate pain. 30 tablet 0   Prenatal MV & Min w/FA-DHA (PRENATAL ADULT GUMMY/DHA/FA PO) Take 2 each by mouth daily.     No current facility-administered medications for this visit.    REVIEW OF SYSTEMS:   10 Point review of Systems was done is negative except as noted above.  PHYSICAL EXAMINATION: ECOG PERFORMANCE STATUS: 1 -  Symptomatic but completely ambulatory  . Vitals:   01/15/21 1027  BP: 97/60  Pulse: 68  Resp: 18  Temp: 98.7 F (37.1 C)  SpO2: 100%    Filed Weights   01/15/21 1027  Weight: 146 lb 1 oz (66.3 kg)    .Body mass index is 26.72 kg/m.  No acute distress GENERAL:alert, in no acute distress and comfortable SKIN: no acute rashes, no significant lesions EYES: conjunctiva are pink and non-injected, sclera anicteric OROPHARYNX: MMM, no exudates, no oropharyngeal erythema or ulceration NECK: supple, no JVD LYMPH:  no palpable lymphadenopathy in the cervical, axillary or inguinal regions LUNGS: clear to auscultation b/l with normal respiratory effort HEART: regular rate & rhythm ABDOMEN:  normoactive bowel sounds , non tender, not distended. Extremity: no pedal edema, no calf pain or tenderness.  PSYCH: alert & oriented x 3 with fluent speech NEURO: no focal motor/sensory deficits  LABORATORY DATA:  I have reviewed the data as listed  . CBC Latest Ref Rng & Units 01/15/2021 11/10/2020 11/02/2020  WBC 4.0 - 10.5 K/uL 5.2 9.6 14.6(H)  Hemoglobin 12.0 - 15.0 g/dL 68.1 27.5 1.7(G)  Hematocrit 36.0 - 46.0 % 39.5 39.3 23.4(L)  Platelets 150 - 400 K/uL 244 376 138(L)    . CMP Latest Ref Rng & Units 01/15/2021 11/10/2020  Glucose 70 - 99 mg/dL 90 92  BUN 6 - 20 mg/dL 01(V) 14   Creatinine 4.94 - 1.00 mg/dL 4.96 7.59  Sodium 163 - 145 mmol/L 140 140  Potassium 3.5 - 5.1 mmol/L 4.4 4.5  Chloride 98 - 111 mmol/L 105 102  CO2 22 - 32 mmol/L 25 28  Calcium 8.9 - 10.3 mg/dL 9.8 9.1  Total Protein 6.5 - 8.1 g/dL 7.3 7.4  Total Bilirubin 0.3 - 1.2 mg/dL 0.6 0.4  Alkaline Phos 38 - 126 U/L 79 110  AST 15 - 41 U/L 16 21  ALT 0 - 44 U/L 17 23   . Lab Results  Component Value Date   IRON 73 01/15/2021   TIBC 395 01/15/2021   IRONPCTSAT 18 01/15/2021   (Iron and TIBC)  Lab Results  Component Value Date   FERRITIN 29 01/15/2021    B12 -- 336  RADIOGRAPHIC STUDIES: I have personally reviewed the radiological images as listed and agreed with the findings in the report. No results found.   ASSESSMENT & PLAN:   35 yo with   1) Acute left soleal DVT triggered by C -section, pregnancy/post partum state. Patient on lovenox. Currently breast feeding  2) Iron deficiency Anemia -- due to pregnancy and blood loss due to C-Section surgery and some postpartum uterine bleeding.  PLAN: -Discussed pt labwork today, 01/15/2021; anemia has resolved.  Hemoglobin is 13.8.  CBC within normal limits. -Ferritin is 29 with an iron saturation of 18%. -Would recommend the patient continue iron polysaccharide 150 mg p.o. daily while breast-feeding and for 6 additional months to maintain and build her stores. -She was recommended to continue Lovenox to complete a total of 3 months of treatment which would be about mid August.  2022.  She notes she just filled her month supply of Lovenox and she was recommended to complete that. -Considering that this was likely a hormonally triggered DVT she will need shared decision making regarding consideration of Lovenox prophylaxis with subsequent pregnancy plus postpartum period or postpartum period along with subsequent pregnancies. -We discussed at length strategies for VTE prevention around situations such as travel and surgery and  subsequent pregnancies. -She will  see [place] back as needed and will return to care with her primary care physician/OB/GYN physician.     All of the patients questions were answered with apparent satisfaction. The patient knows to call the clinic with any problems, questions or concerns.   The total time spent in the appointment was 20 minutes and more than 50% was on counseling and direct patient cares.    Wyvonnia Lora MD MS AAHIVMS [facility] Hematology/Oncology Physician [facility]  (Office):       [phone] (Work cell):  [phone] (Fax):           [phone]  01/14/2021 8:49 PM  I, Minda Meo, am acting as scribe for Dr. Wyvonnia Lora, MD.  .I have reviewed the above documentation for accuracy and completeness, and I agree with the above. Johney Maine MD

## 2021-01-15 ENCOUNTER — Other Ambulatory Visit: Payer: Self-pay

## 2021-01-15 ENCOUNTER — Inpatient Hospital Stay: Payer: BC Managed Care – PPO | Attending: Hematology | Admitting: Hematology

## 2021-01-15 ENCOUNTER — Inpatient Hospital Stay: Payer: BC Managed Care – PPO

## 2021-01-15 VITALS — BP 97/60 | HR 68 | Temp 98.7°F | Resp 18 | Wt 146.1 lb

## 2021-01-15 DIAGNOSIS — D62 Acute posthemorrhagic anemia: Secondary | ICD-10-CM

## 2021-01-15 DIAGNOSIS — I824Z2 Acute embolism and thrombosis of unspecified deep veins of left distal lower extremity: Secondary | ICD-10-CM

## 2021-01-15 DIAGNOSIS — Z7901 Long term (current) use of anticoagulants: Secondary | ICD-10-CM | POA: Diagnosis not present

## 2021-01-15 DIAGNOSIS — Z86718 Personal history of other venous thrombosis and embolism: Secondary | ICD-10-CM | POA: Diagnosis present

## 2021-01-15 LAB — SAMPLE TO BLOOD BANK

## 2021-01-15 LAB — CBC WITH DIFFERENTIAL (CANCER CENTER ONLY)
Abs Immature Granulocytes: 0.02 10*3/uL (ref 0.00–0.07)
Basophils Absolute: 0 10*3/uL (ref 0.0–0.1)
Basophils Relative: 1 %
Eosinophils Absolute: 0.2 10*3/uL (ref 0.0–0.5)
Eosinophils Relative: 4 %
HCT: 39.5 % (ref 36.0–46.0)
Hemoglobin: 13.8 g/dL (ref 12.0–15.0)
Immature Granulocytes: 0 %
Lymphocytes Relative: 40 %
Lymphs Abs: 2.1 10*3/uL (ref 0.7–4.0)
MCH: 30.3 pg (ref 26.0–34.0)
MCHC: 34.9 g/dL (ref 30.0–36.0)
MCV: 86.6 fL (ref 80.0–100.0)
Monocytes Absolute: 0.6 10*3/uL (ref 0.1–1.0)
Monocytes Relative: 11 %
Neutro Abs: 2.3 10*3/uL (ref 1.7–7.7)
Neutrophils Relative %: 44 %
Platelet Count: 244 10*3/uL (ref 150–400)
RBC: 4.56 MIL/uL (ref 3.87–5.11)
RDW: 13 % (ref 11.5–15.5)
WBC Count: 5.2 10*3/uL (ref 4.0–10.5)
nRBC: 0 % (ref 0.0–0.2)

## 2021-01-15 LAB — IRON AND TIBC
Iron: 73 ug/dL (ref 28–170)
Saturation Ratios: 18 % (ref 10.4–31.8)
TIBC: 395 ug/dL (ref 250–450)
UIBC: 322 ug/dL

## 2021-01-15 LAB — CMP (CANCER CENTER ONLY)
ALT: 17 U/L (ref 0–44)
AST: 16 U/L (ref 15–41)
Albumin: 4.8 g/dL (ref 3.5–5.0)
Alkaline Phosphatase: 79 U/L (ref 38–126)
Anion gap: 10 (ref 5–15)
BUN: 29 mg/dL — ABNORMAL HIGH (ref 6–20)
CO2: 25 mmol/L (ref 22–32)
Calcium: 9.8 mg/dL (ref 8.9–10.3)
Chloride: 105 mmol/L (ref 98–111)
Creatinine: 0.55 mg/dL (ref 0.44–1.00)
GFR, Estimated: >60 mL/min (ref >60)
Glucose, Bld: 90 mg/dL (ref 70–99)
Potassium: 4.4 mmol/L (ref 3.5–5.1)
Sodium: 140 mmol/L (ref 135–145)
Total Bilirubin: 0.6 mg/dL (ref 0.3–1.2)
Total Protein: 7.3 g/dL (ref 6.5–8.1)

## 2021-01-15 LAB — VITAMIN B12: Vitamin B-12: 336 pg/mL (ref 180–914)

## 2021-01-15 LAB — FERRITIN: Ferritin: 29 ng/mL (ref 11–307)

## 2021-01-15 MED ORDER — POLYSACCHARIDE IRON COMPLEX 150 MG PO CAPS: 150.0000 mg | ORAL_CAPSULE | Freq: Every day | ORAL | 2 refills | Status: AC

## 2021-01-18 NOTE — Progress Notes (Signed)
Per Dr Candise Che : left pt a message stating that she needs to continue Iron polysaccharide 150mg  po daily since her ferritin did come down to 29. Requested pt to call back with any questions.
